# Patient Record
Sex: Female | Born: 1965
Health system: Southern US, Community
[De-identification: ages and names within clinical notes are randomized; demographics above are authoritative.]

## PROBLEM LIST (undated history)

## (undated) DIAGNOSIS — I10 Essential (primary) hypertension: Secondary | ICD-10-CM

## (undated) DIAGNOSIS — Z8269 Family history of other diseases of the musculoskeletal system and connective tissue: Secondary | ICD-10-CM

## (undated) DIAGNOSIS — K561 Intussusception: Secondary | ICD-10-CM

## (undated) DIAGNOSIS — R0789 Other chest pain: Secondary | ICD-10-CM

## (undated) DIAGNOSIS — M51369 Other intervertebral disc degeneration, lumbar region without mention of lumbar back pain or lower extremity pain: Secondary | ICD-10-CM

## (undated) DIAGNOSIS — E559 Vitamin D deficiency, unspecified: Secondary | ICD-10-CM

## (undated) DIAGNOSIS — F488 Other specified nonpsychotic mental disorders: Secondary | ICD-10-CM

## (undated) DIAGNOSIS — M2550 Pain in unspecified joint: Secondary | ICD-10-CM

## (undated) DIAGNOSIS — Z9889 Other specified postprocedural states: Secondary | ICD-10-CM

## (undated) DIAGNOSIS — R232 Flushing: Secondary | ICD-10-CM

## (undated) DIAGNOSIS — R600 Localized edema: Secondary | ICD-10-CM

## (undated) DIAGNOSIS — M549 Dorsalgia, unspecified: Secondary | ICD-10-CM

## (undated) DIAGNOSIS — G479 Sleep disorder, unspecified: Secondary | ICD-10-CM

## (undated) DIAGNOSIS — F419 Anxiety disorder, unspecified: Secondary | ICD-10-CM

## (undated) DIAGNOSIS — Z780 Asymptomatic menopausal state: Secondary | ICD-10-CM

## (undated) DIAGNOSIS — R4189 Other symptoms and signs involving cognitive functions and awareness: Secondary | ICD-10-CM

## (undated) DIAGNOSIS — R112 Nausea with vomiting, unspecified: Secondary | ICD-10-CM

## (undated) DIAGNOSIS — M5136 Other intervertebral disc degeneration, lumbar region: Secondary | ICD-10-CM

## (undated) DIAGNOSIS — R635 Abnormal weight gain: Secondary | ICD-10-CM

## (undated) DIAGNOSIS — E785 Hyperlipidemia, unspecified: Secondary | ICD-10-CM

## (undated) DIAGNOSIS — K829 Disease of gallbladder, unspecified: Secondary | ICD-10-CM

## (undated) DIAGNOSIS — R0600 Dyspnea, unspecified: Secondary | ICD-10-CM

## (undated) HISTORY — DX: Disease of gallbladder, unspecified: K82.9

## (undated) HISTORY — DX: Flushing: R23.2

## (undated) HISTORY — DX: Other chest pain: R07.89

## (undated) HISTORY — DX: Essential (primary) hypertension: I10

## (undated) HISTORY — DX: Intussusception: K56.1

## (undated) HISTORY — DX: Dyspnea, unspecified: R06.00

## (undated) HISTORY — DX: Other symptoms and signs involving cognitive functions and awareness: R41.89

## (undated) HISTORY — PX: GALLBLADDER SURGERY: SHX652

## (undated) HISTORY — DX: Vitamin D deficiency, unspecified: E55.9

## (undated) HISTORY — DX: Dorsalgia, unspecified: M54.9

## (undated) HISTORY — DX: Other intervertebral disc degeneration, lumbar region: M51.36

## (undated) HISTORY — DX: Anxiety disorder, unspecified: F41.9

## (undated) HISTORY — DX: Other intervertebral disc degeneration, lumbar region without mention of lumbar back pain or lower extremity pain: M51.369

## (undated) HISTORY — DX: Sleep disorder, unspecified: G47.9

## (undated) HISTORY — DX: Hyperlipidemia, unspecified: E78.5

## (undated) HISTORY — DX: Pain in unspecified joint: M25.50

## (undated) HISTORY — DX: Localized edema: R60.0

## (undated) HISTORY — DX: Abnormal weight gain: R63.5

## (undated) HISTORY — DX: Asymptomatic menopausal state: Z78.0

## (undated) HISTORY — DX: Family history of other diseases of the musculoskeletal system and connective tissue: Z82.69

---
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
## 1998-01-04 ENCOUNTER — Inpatient Hospital Stay (HOSPITAL_COMMUNITY): Admission: AD | Admit: 1998-01-04 | Discharge: 1998-01-06 | Payer: Self-pay | Admitting: *Deleted

## 1998-01-25 ENCOUNTER — Encounter (HOSPITAL_COMMUNITY): Admission: RE | Admit: 1998-01-25 | Discharge: 1998-04-25 | Payer: Self-pay | Admitting: Obstetrics and Gynecology

## 1998-02-08 ENCOUNTER — Other Ambulatory Visit: Admission: RE | Admit: 1998-02-08 | Discharge: 1998-02-08 | Payer: Self-pay | Admitting: Obstetrics and Gynecology

## 1998-04-27 ENCOUNTER — Encounter (HOSPITAL_COMMUNITY): Admission: RE | Admit: 1998-04-27 | Discharge: 1998-07-26 | Payer: Self-pay | Admitting: *Deleted

## 1998-07-27 ENCOUNTER — Encounter (HOSPITAL_COMMUNITY): Admission: RE | Admit: 1998-07-27 | Discharge: 1998-10-25 | Payer: Self-pay | Admitting: *Deleted

## 1999-09-11 ENCOUNTER — Other Ambulatory Visit: Admission: RE | Admit: 1999-09-11 | Discharge: 1999-09-11 | Payer: Self-pay | Admitting: Obstetrics and Gynecology

## 2000-05-11 ENCOUNTER — Inpatient Hospital Stay (HOSPITAL_COMMUNITY): Admission: AD | Admit: 2000-05-11 | Discharge: 2000-05-11 | Payer: Self-pay | Admitting: Obstetrics and Gynecology

## 2000-05-12 ENCOUNTER — Inpatient Hospital Stay (HOSPITAL_COMMUNITY): Admission: AD | Admit: 2000-05-12 | Discharge: 2000-05-15 | Payer: Self-pay | Admitting: Obstetrics and Gynecology

## 2000-05-18 ENCOUNTER — Encounter: Admission: RE | Admit: 2000-05-18 | Discharge: 2000-08-16 | Payer: Self-pay | Admitting: Obstetrics and Gynecology

## 2000-06-28 ENCOUNTER — Other Ambulatory Visit: Admission: RE | Admit: 2000-06-28 | Discharge: 2000-06-28 | Payer: Self-pay | Admitting: Obstetrics and Gynecology

## 2000-08-18 ENCOUNTER — Encounter: Admission: RE | Admit: 2000-08-18 | Discharge: 2000-09-17 | Payer: Self-pay | Admitting: Obstetrics and Gynecology

## 2000-09-18 ENCOUNTER — Encounter: Admission: RE | Admit: 2000-09-18 | Discharge: 2000-10-18 | Payer: Self-pay | Admitting: Obstetrics and Gynecology

## 2000-11-18 ENCOUNTER — Encounter: Admission: RE | Admit: 2000-11-18 | Discharge: 2000-12-18 | Payer: Self-pay | Admitting: Obstetrics and Gynecology

## 2000-12-19 ENCOUNTER — Encounter: Admission: RE | Admit: 2000-12-19 | Discharge: 2001-01-18 | Payer: Self-pay | Admitting: Obstetrics and Gynecology

## 2001-02-18 ENCOUNTER — Encounter: Admission: RE | Admit: 2001-02-18 | Discharge: 2001-03-20 | Payer: Self-pay | Admitting: Obstetrics and Gynecology

## 2001-02-25 ENCOUNTER — Encounter: Admission: RE | Admit: 2001-02-25 | Discharge: 2001-05-26 | Payer: Self-pay | Admitting: Internal Medicine

## 2001-04-20 ENCOUNTER — Encounter: Admission: RE | Admit: 2001-04-20 | Discharge: 2001-05-20 | Payer: Self-pay | Admitting: Obstetrics and Gynecology

## 2001-05-21 ENCOUNTER — Encounter: Admission: RE | Admit: 2001-05-21 | Discharge: 2001-06-20 | Payer: Self-pay | Admitting: Obstetrics and Gynecology

## 2001-09-09 ENCOUNTER — Encounter: Payer: Self-pay | Admitting: Obstetrics and Gynecology

## 2001-09-09 ENCOUNTER — Encounter: Admission: RE | Admit: 2001-09-09 | Discharge: 2001-09-09 | Payer: Self-pay | Admitting: Obstetrics and Gynecology

## 2001-10-12 ENCOUNTER — Other Ambulatory Visit: Admission: RE | Admit: 2001-10-12 | Discharge: 2001-10-12 | Payer: Self-pay | Admitting: Obstetrics and Gynecology

## 2003-02-12 ENCOUNTER — Other Ambulatory Visit: Admission: RE | Admit: 2003-02-12 | Discharge: 2003-02-12 | Payer: Self-pay | Admitting: Obstetrics and Gynecology

## 2004-12-18 ENCOUNTER — Encounter: Admission: RE | Admit: 2004-12-18 | Discharge: 2004-12-18 | Payer: Self-pay | Admitting: Internal Medicine

## 2006-05-29 ENCOUNTER — Emergency Department (HOSPITAL_COMMUNITY): Admission: EM | Admit: 2006-05-29 | Discharge: 2006-05-29 | Payer: Self-pay | Admitting: Family Medicine

## 2007-07-15 ENCOUNTER — Telehealth: Payer: Self-pay | Admitting: *Deleted

## 2007-08-12 ENCOUNTER — Encounter: Admission: RE | Admit: 2007-08-12 | Discharge: 2007-08-12 | Payer: Self-pay | Admitting: Obstetrics and Gynecology

## 2009-11-14 ENCOUNTER — Emergency Department (HOSPITAL_COMMUNITY): Admission: EM | Admit: 2009-11-14 | Discharge: 2009-11-15 | Payer: Self-pay | Admitting: Emergency Medicine

## 2009-11-16 ENCOUNTER — Emergency Department (HOSPITAL_COMMUNITY): Admission: EM | Admit: 2009-11-16 | Discharge: 2009-11-16 | Payer: Self-pay | Admitting: Family Medicine

## 2009-11-22 ENCOUNTER — Encounter (INDEPENDENT_AMBULATORY_CARE_PROVIDER_SITE_OTHER): Payer: Self-pay | Admitting: General Surgery

## 2009-11-22 ENCOUNTER — Observation Stay (HOSPITAL_COMMUNITY): Admission: RE | Admit: 2009-11-22 | Discharge: 2009-11-23 | Payer: Self-pay | Admitting: General Surgery

## 2010-01-15 ENCOUNTER — Encounter: Admission: RE | Admit: 2010-01-15 | Discharge: 2010-01-15 | Payer: Self-pay | Admitting: Obstetrics and Gynecology

## 2010-05-11 ENCOUNTER — Encounter: Payer: Self-pay | Admitting: Obstetrics and Gynecology

## 2010-07-04 LAB — BASIC METABOLIC PANEL
BUN: 9 mg/dL (ref 6–23)
GFR calc non Af Amer: >60 mL/min (ref >60)
Glucose, Bld: 104 mg/dL — ABNORMAL HIGH (ref 70–99)
Potassium: 3.8 mEq/L (ref 3.5–5.1)

## 2010-07-04 LAB — CBC
HCT: 39.8 % (ref 36.0–46.0)
MCHC: 35.9 g/dL (ref 30.0–36.0)
MCV: 84.5 fL (ref 78.0–100.0)
RDW: 12 % (ref 11.5–15.5)

## 2010-07-04 LAB — SURGICAL PCR SCREEN: MRSA, PCR: NEGATIVE

## 2010-07-05 LAB — POCT I-STAT, CHEM 8
BUN: 13 mg/dL (ref 6–23)
BUN: 15 mg/dL (ref 6–23)
Calcium, Ion: 1.12 mmol/L (ref 1.12–1.32)
Chloride: 106 meq/L (ref 96–112)
Creatinine, Ser: 0.8 mg/dL (ref 0.4–1.2)
Glucose, Bld: 109 mg/dL — ABNORMAL HIGH (ref 70–99)
HCT: 38 % (ref 36.0–46.0)
HCT: 43 % (ref 36.0–46.0)
Hemoglobin: 12.9 g/dL (ref 12.0–15.0)
Hemoglobin: 14.6 g/dL (ref 12.0–15.0)
Potassium: 3.8 meq/L (ref 3.5–5.1)
Sodium: 138 mEq/L (ref 135–145)
Sodium: 140 mEq/L (ref 135–145)
TCO2: 23 mmol/L (ref 0–100)
TCO2: 27 mmol/L (ref 0–100)

## 2010-07-05 LAB — CBC
HCT: 37.8 % (ref 36.0–46.0)
MCH: 31 pg (ref 26.0–34.0)
MCHC: 34.7 g/dL (ref 30.0–36.0)
MCV: 88.7 fL (ref 78.0–100.0)
MCV: 89.1 fL (ref 78.0–100.0)
Platelets: 241 10*3/uL (ref 150–400)
RBC: 4.66 MIL/uL (ref 3.87–5.11)
RDW: 12.5 % (ref 11.5–15.5)
RDW: 12.9 % (ref 11.5–15.5)

## 2010-07-05 LAB — DIFFERENTIAL
Eosinophils Absolute: 0.1 10*3/uL (ref 0.0–0.7)
Eosinophils Relative: 2 % (ref 0–5)
Lymphs Abs: 0.9 10*3/uL (ref 0.7–4.0)
Monocytes Absolute: 0.3 10*3/uL (ref 0.1–1.0)

## 2010-07-05 LAB — HEPATIC FUNCTION PANEL
Alkaline Phosphatase: 69 U/L (ref 39–117)
Bilirubin, Direct: 0.1 mg/dL (ref 0.0–0.3)
Bilirubin, Direct: 0.3 mg/dL (ref 0.0–0.3)
Indirect Bilirubin: 0.2 mg/dL — ABNORMAL LOW (ref 0.3–0.9)
Indirect Bilirubin: 0.8 mg/dL (ref 0.3–0.9)
Total Bilirubin: 0.5 mg/dL (ref 0.3–1.2)
Total Protein: 7.3 g/dL (ref 6.0–8.3)

## 2010-07-05 LAB — POCT CARDIAC MARKERS
CKMB, poc: <1 ng/mL — ABNORMAL LOW (ref 1.0–8.0)
Myoglobin, poc: 51 ng/mL (ref 12–200)
Troponin i, poc: <0.05 ng/mL (ref 0.00–0.09)

## 2010-07-05 LAB — LIPASE, BLOOD: Lipase: 35 U/L (ref 11–59)

## 2010-07-05 LAB — POCT URINALYSIS DIP (DEVICE)
Bilirubin Urine: NEGATIVE
Glucose, UA: NEGATIVE mg/dL
Hgb urine dipstick: NEGATIVE
Nitrite: NEGATIVE
Urobilinogen, UA: 1 mg/dL (ref 0.0–1.0)

## 2010-09-05 NOTE — H&P (Signed)
[facility]  Patient:    Lori Luna, Lori Luna                      MRN: [id number] Adm. Date:  [id number] Attending:  Genia Del                         History and Physical  CHIEF COMPLAINT:              Labor.  HISTORY OF PRESENT ILLNESS:   [age]-year-old white female, G3, P2, EDD of May 23, 2000, at 38+ weeks, in active labor.  ALLERGIES:                    No known drug allergies.  MEDICATIONS:                  Prenatal vitamins.  PAST MEDICAL HISTORY:         History of spontaneous vaginal delivery x 2.  No other medical or surgical hospitalizations, except for rhinoplasty.  FAMILY HISTORY:               Hypertension, heart disease, insulin-dependent diabetes.  PREGNANCY HISTORY:            Remarkable for blood type O negative.  Father of the baby is B negative.  Rh antibody negative.  Rubella nonimmune.  Hepatitis B surface antigen negative.  HIV nonreactive.  Pregnancy complicated by borderline elevation of blood pressure and presumed macrosomia.  PHYSICAL EXAMINATION:  GENERAL:                      Well-developed, well-nourished white female in no apparent distress.  HEENT:                        Normal.  LUNGS:                        Clear.  HEART:                        Regular rhythm.  ABDOMEN:                      Soft, gravid, nontender.  Estimated fetal weight 8-1/2 to 9 pounds.  PELVIC:                       Cervix is 6-7 cm, 90%, vertex, -2.  Artificial rupture of membranes clear.  NEUROLOGIC:                   Nonfocal.  EXTREMITIES:                  DTRs 1 to 2+.  No evidence of clonus.  LABORATORY DATA:              CBC revealing a normal platelet count of 202 and a normal hemoglobin.  She had a uric acid of 6.9.  Normal liver function tests except for a borderline elevation of SGPT at 43.  IMPRESSION:                   1. Gestational hypertension with no stigmata of  preeclampsia.  2. Active labor.                               3. Presumed large for gestational age.                               4. History of gestational hypertension in                                  previous pregnancy.  PLAN:                         Proceed with epidural, artificial rupture of membranes, and attempted vaginal delivery.  Will monitor blood pressures closely postpartum.  If blood pressure elevation is persistent, will use magnesium prophylaxis.  Will consider repeating laboratories in six hours. DD:  05/12/00 TD:  05/12/00 Job: [id number]

## 2011-08-20 ENCOUNTER — Other Ambulatory Visit: Payer: Self-pay | Admitting: Dermatology

## 2012-04-21 ENCOUNTER — Other Ambulatory Visit: Payer: Self-pay | Admitting: Dermatology

## 2012-04-27 ENCOUNTER — Other Ambulatory Visit: Payer: Self-pay | Admitting: Family Medicine

## 2012-04-27 ENCOUNTER — Other Ambulatory Visit (HOSPITAL_COMMUNITY)
Admission: RE | Admit: 2012-04-27 | Discharge: 2012-04-27 | Disposition: A | Discharge status: Home / Self Care | Payer: BC Managed Care – PPO | Source: Ambulatory Visit | Attending: Family Medicine | Admitting: Family Medicine

## 2012-04-27 DIAGNOSIS — N6310 Unspecified lump in the right breast, unspecified quadrant: Secondary | ICD-10-CM

## 2012-04-27 DIAGNOSIS — Z124 Encounter for screening for malignant neoplasm of cervix: Secondary | ICD-10-CM | POA: Insufficient documentation

## 2012-05-03 ENCOUNTER — Other Ambulatory Visit: Payer: Self-pay

## 2012-05-11 ENCOUNTER — Other Ambulatory Visit: Payer: Self-pay

## 2013-04-28 ENCOUNTER — Other Ambulatory Visit: Payer: Self-pay

## 2013-04-28 DIAGNOSIS — Z1231 Encounter for screening mammogram for malignant neoplasm of breast: Secondary | ICD-10-CM

## 2013-05-02 ENCOUNTER — Telehealth: Payer: Self-pay

## 2013-05-02 MED ORDER — ENALAPRIL-HYDROCHLOROTHIAZIDE 5-12.5 MG PO TABS
1.0000 | ORAL_TABLET | Freq: Every day | ORAL | Status: DC
Start: 2013-05-02 — End: 2019-02-13

## 2013-05-02 NOTE — Telephone Encounter (Signed)
Refilled for 15 day supply, with note stating pt needs to call for appt asap.

## 2013-06-30 ENCOUNTER — Ambulatory Visit
Admission: RE | Admit: 2013-06-30 | Discharge: 2013-06-30 | Disposition: A | Discharge status: Home / Self Care | Payer: BC Managed Care – PPO | Source: Ambulatory Visit

## 2013-06-30 ENCOUNTER — Other Ambulatory Visit: Payer: Self-pay

## 2013-06-30 ENCOUNTER — Ambulatory Visit: Payer: BC Managed Care – PPO

## 2013-06-30 DIAGNOSIS — Z1231 Encounter for screening mammogram for malignant neoplasm of breast: Secondary | ICD-10-CM

## 2013-07-03 ENCOUNTER — Other Ambulatory Visit: Payer: Self-pay | Admitting: Family Medicine

## 2013-07-03 DIAGNOSIS — R928 Other abnormal and inconclusive findings on diagnostic imaging of breast: Secondary | ICD-10-CM

## 2013-07-14 ENCOUNTER — Ambulatory Visit
Admission: RE | Admit: 2013-07-14 | Discharge: 2013-07-14 | Disposition: A | Discharge status: Home / Self Care | Payer: BC Managed Care – PPO | Source: Ambulatory Visit | Attending: Family Medicine | Admitting: Family Medicine

## 2013-07-14 DIAGNOSIS — R928 Other abnormal and inconclusive findings on diagnostic imaging of breast: Secondary | ICD-10-CM

## 2013-08-04 ENCOUNTER — Ambulatory Visit
Admission: RE | Admit: 2013-08-04 | Discharge: 2013-08-04 | Disposition: A | Discharge status: Home / Self Care | Payer: BC Managed Care – PPO | Source: Ambulatory Visit | Attending: Family Medicine | Admitting: Family Medicine

## 2013-08-04 ENCOUNTER — Other Ambulatory Visit: Payer: Self-pay | Admitting: Family Medicine

## 2013-08-04 DIAGNOSIS — N6320 Unspecified lump in the left breast, unspecified quadrant: Secondary | ICD-10-CM

## 2013-08-04 DIAGNOSIS — N63 Unspecified lump in unspecified breast: Secondary | ICD-10-CM

## 2013-08-04 HISTORY — PX: BREAST BIOPSY: SHX20

## 2013-08-07 ENCOUNTER — Ambulatory Visit
Admission: RE | Admit: 2013-08-07 | Discharge: 2013-08-07 | Disposition: A | Discharge status: Home / Self Care | Payer: BC Managed Care – PPO | Source: Ambulatory Visit | Attending: Family Medicine | Admitting: Family Medicine

## 2013-08-07 ENCOUNTER — Other Ambulatory Visit: Payer: Self-pay | Admitting: Family Medicine

## 2013-08-07 DIAGNOSIS — N6320 Unspecified lump in the left breast, unspecified quadrant: Secondary | ICD-10-CM

## 2013-08-07 HISTORY — PX: BREAST BIOPSY: SHX20

## 2013-08-16 ENCOUNTER — Other Ambulatory Visit: Payer: BC Managed Care – PPO

## 2014-03-18 ENCOUNTER — Encounter: Payer: Self-pay | Admitting: *Deleted

## 2016-06-08 DIAGNOSIS — E559 Vitamin D deficiency, unspecified: Secondary | ICD-10-CM | POA: Diagnosis not present

## 2016-06-08 DIAGNOSIS — Z0000 Encounter for general adult medical examination without abnormal findings: Secondary | ICD-10-CM | POA: Diagnosis not present

## 2016-06-08 DIAGNOSIS — I10 Essential (primary) hypertension: Secondary | ICD-10-CM | POA: Diagnosis not present

## 2016-06-08 DIAGNOSIS — E784 Other hyperlipidemia: Secondary | ICD-10-CM | POA: Diagnosis not present

## 2016-06-10 ENCOUNTER — Telehealth: Payer: Self-pay | Admitting: Cardiovascular Disease

## 2016-06-10 NOTE — Telephone Encounter (Signed)
Records received from [place] at [facility] for apt on 06/19/16 with Dr Oval Linsey. Records put in Dr Blenda Mounts schedule. CN

## 2016-06-19 ENCOUNTER — Ambulatory Visit (INDEPENDENT_AMBULATORY_CARE_PROVIDER_SITE_OTHER): Payer: 59 | Admitting: Cardiovascular Disease

## 2016-06-19 ENCOUNTER — Encounter: Payer: Self-pay | Admitting: Cardiovascular Disease

## 2016-06-19 VITALS — BP 124/78 | HR 79 | Ht 66.5 in | Wt 216.0 lb

## 2016-06-19 DIAGNOSIS — I10 Essential (primary) hypertension: Secondary | ICD-10-CM | POA: Insufficient documentation

## 2016-06-19 DIAGNOSIS — R079 Chest pain, unspecified: Secondary | ICD-10-CM

## 2016-06-19 DIAGNOSIS — R0789 Other chest pain: Secondary | ICD-10-CM | POA: Diagnosis not present

## 2016-06-19 DIAGNOSIS — E785 Hyperlipidemia, unspecified: Secondary | ICD-10-CM | POA: Insufficient documentation

## 2016-06-19 DIAGNOSIS — E7800 Pure hypercholesterolemia, unspecified: Secondary | ICD-10-CM | POA: Diagnosis not present

## 2016-06-19 HISTORY — DX: Hyperlipidemia, unspecified: E78.5

## 2016-06-19 HISTORY — DX: Other chest pain: R07.89

## 2016-06-19 HISTORY — DX: Essential (primary) hypertension: I10

## 2016-06-19 NOTE — Patient Instructions (Addendum)
Medication Instructions:  Your physician recommends that you continue on your current medications as directed. Please refer to the Current Medication list given to you today.  Labwork: NONE  Testing/Procedures: CARDIAC CTA   Follow-Up: Your physician wants you to follow-up in: 1 [place] will receive a reminder letter in the mail two months in advance. If you don't receive a letter, please call our office to schedule the follow-up appointment.  If you need a refill on your cardiac medications before your next appointment, please call your pharmacy.   Cardiac CT Angiogram A cardiac CT angiogram is a procedure to look at the heart and the area around the heart. It may be done to help find the cause of chest pains or other symptoms of heart disease. During this procedure, a large X-ray machine, called a CT scanner, takes detailed pictures of the heart and the surrounding area after a dye (contrast material) has been injected into blood vessels in the area. The procedure is also sometimes called a coronary CT angiogram, coronary artery scanning, or CTA. A cardiac CT angiogram allows the health care provider to see how well blood is flowing to and from the heart. The health care provider will be able to see if there are any problems, such as:  Blockage or narrowing of the coronary arteries in the heart.  Fluid around the heart.  Signs of weakness or disease in the muscles, valves, and tissues of the heart. Tell a health care provider about:  Any allergies you have. This is especially important if you have had a previous allergic reaction to contrast dye.  All medicines you are taking, including vitamins, herbs, eye drops, creams, and over-the-counter medicines.  Any blood disorders you have.  Any surgeries you have had.  Any medical conditions you have.  Whether you are pregnant or may be pregnant.  Any anxiety disorders, chronic pain, or other conditions you have that may  increase your stress or prevent you from lying still. What are the risks? Generally, this is a safe procedure. However, problems may occur, including:  Bleeding.  Infection.  Allergic reactions to medicines or dyes.  Damage to other structures or organs.  Kidney damage from the dye or contrast that is used.  Increased risk of cancer from radiation exposure. This risk is low. Talk with your health care provider about:  The risks and benefits of testing.  How you can receive the lowest dose of radiation. What happens before the procedure?  Wear comfortable clothing and remove any jewelry, glasses, dentures, and hearing aids.  Follow instructions from your health care provider about eating and drinking. This may include:  For 12 hours before the test - avoid caffeine. This includes tea, coffee, soda, energy drinks, and diet pills. Drink plenty of water or other fluids that do not have caffeine in them. Being well-hydrated can prevent complications.  For 4-6 hours before the test - stop eating and drinking. The contrast dye can cause nausea, but this is less likely if your stomach is empty.  Ask your health care provider about changing or stopping your regular medicines. This is especially important if you are taking diabetes medicines, blood thinners, or medicines to treat erectile dysfunction. What happens during the procedure?  Hair on your chest may need to be removed so that small sticky patches called electrodes can be placed on your chest. These will transmit information that helps to monitor your heart during the test.  An IV tube will be  inserted into one of your veins.  You might be given a medicine to control your heart rate during the test. This will help to ensure that good images are obtained.  You will be asked to lie on an exam table. This table will slide in and out of the CT machine during the procedure.  Contrast dye will be injected into the IV tube. You might  feel warm, or you may get a metallic taste in your mouth.  You will be given a medicine (nitroglycerin) to relax (dilate) the arteries in your heart.  The table that you are lying on will move into the CT machine tunnel for the scan.  The person running the machine will give you instructions while the scans are being done. You may be asked to:  Keep your arms above your head.  Hold your breath.  Stay very still, even if the table is moving.  When the scanning is complete, you will be moved out of the machine.  The IV tube will be removed. The procedure may vary among health care providers and hospitals. What happens after the procedure?  You might feel warm, or you may get a metallic taste in your mouth from the contrast dye.  You may have a headache from the nitroglycerin.  After the procedure, drink water or other fluids to wash (flush) the contrast material out of your body.  Contact a health care provider if you have any symptoms of allergy to the contrast. These symptoms include:  Shortness of breath.  Rash or hives.  A racing heartbeat.  Most people can return to their normal activities right after the procedure. Ask your health care provider what activities are safe for you.  It is up to you to get the results of your procedure. Ask your health care provider, or the department that is doing the procedure, when your results will be ready. Summary  A cardiac CT angiogram is a procedure to look at the heart and the area around the heart. It may be done to help find the cause of chest pains or other symptoms of heart disease.  During this procedure, a large X-ray machine, called a CT scanner, takes detailed pictures of the heart and the surrounding area after a dye (contrast material) has been injected into blood vessels in the area.  Ask your health care provider about changing or stopping your regular medicines before the procedure. This is especially important if you  are taking diabetes medicines, blood thinners, or medicines to treat erectile dysfunction.  After the procedure, drink water or other fluids to wash (flush) the contrast material out of your body. This information is not intended to replace advice given to you by your health care provider. Make sure you discuss any questions you have with your health care provider. Document Released: 03/19/2008 Document Revised: 02/24/2016 Document Reviewed: 02/24/2016 Elsevier Interactive Patient Education  2017 [employer].

## 2016-06-19 NOTE — Progress Notes (Signed)
Cardiology Office Note   Date:  06/19/2016   ID:  PROMYSE GUNNISON, DOB 01-01-1966, MRN [id number]  PCP:  Cari Caraway, MD  Cardiologist:   Skeet Latch, MD   Chief Complaint  Patient presents with  . New Evaluation    pt c/o chest pain/pressure and has early familiy history of CAD, previous pt of DR. Varanasi     History of Present Illness: Lori Luna is a 51 y.o. female with hypertension and hyperlipidemia who presents for cardiovascular risk assessment.  Lori Luna saw Dr. Cari Caraway on 06/08/16. She was asymptomatic but requested referral to cardiology for cardiovascular risk assessment due to a family history of coronary artery disease.  She reports episodes of chest heaviness that has been ongoing since she took prednisone almost two months ago.  The sensation occurs in her left chest and is worse With exertion but is always present. There is no associated shortness of breath, nausea, or diaphoresis. It feels like a heaviness in her chest. It radiates towards her left arm. She wonders if anything could be related to stress. She notes that her cholesterol has always been a little bit on the high side but she has never required any medications. She also notes intermittent episodes of palpitations that last for a few seconds at a time. There is no associated lightheadedness, dizziness, or chest pain.  She denies lower extremity edema, orthopnea, or PND. She started back exercising 2 months ago and notes that after 20 minutes she feels exhausted. The heaviness is worse with this as well as when she tries to carry heavy objects.  Her father had several heart attacks. The first was at age 43. She checks her blood pressure at home and it typically is around 130/80.    Past Medical History:  Diagnosis Date  . Atypical chest pain 06/19/2016  . Essential hypertension 06/19/2016  . Hyperlipidemia   . Hyperlipidemia 06/19/2016  . Hypertension     Past Surgical History:    Procedure Laterality Date  . GALLBLADDER SURGERY       Current Outpatient Prescriptions  Medication Sig Dispense Refill  . Calcium Carbonate-Vit D-Min (CALCIUM 1200 PO) Take 1,200 mg by mouth daily.    . Enalapril-Hydrochlorothiazide 5-12.5 MG per tablet Take 1 tablet by mouth daily. 15 tablet 0  . MAGNESIUM PO Take 1 capsule by mouth daily.    . meclizine (ANTIVERT) 25 MG tablet Take 25 mg by mouth 3 (three) times daily as needed for dizziness.    . pyridoxine (B-6) 100 MG tablet Take 100 mg by mouth daily.    . rizatriptan (MAXALT) 10 MG tablet Take 10 mg by mouth as needed for migraine. May repeat in 2 hours if needed    . Vitamin D, Ergocalciferol, (DRISDOL) 50000 units CAPS capsule Take 50,000 Units by mouth once a week.     No current facility-administered medications for this visit.     Allergies:   Patient has no allergy information on record.    Social History:  The patient  reports that she has never smoked. She has never used smokeless tobacco. She reports that she drinks alcohol. She reports that she does not use drugs.   Family History:  The patient's family history includes CAD in her father and maternal grandmother; Cancer in her paternal grandfather and paternal grandmother; Diabetes in her maternal grandmother; Hypertension in her father and mother; Stroke in her maternal aunt.    ROS:  Please see the history of  present illness.   Otherwise, review of systems are positive for none.   All other systems are reviewed and negative.    PHYSICAL EXAM: VS:  BP 124/78   Pulse 79   Ht 5' 6.5" (1.689 m)   Wt 98 kg (216 lb)   BMI 34.34 kg/m  , BMI Body mass index is 34.34 kg/m. GENERAL:  Well appearing HEENT:  Pupils equal round and reactive, fundi not visualized, oral mucosa unremarkable NECK:  No jugular venous distention, waveform within normal limits, carotid upstroke brisk and symmetric, no bruits, no thyromegaly LYMPHATICS:  No cervical adenopathy LUNGS:  Clear  to auscultation bilaterally HEART:  RRR.  PMI not displaced or sustained,S1 and S2 within normal limits, no S3, no S4, no clicks, no rubs, no murmurs ABD:  Flat, positive bowel sounds normal in frequency in pitch, no bruits, no rebound, no guarding, no midline pulsatile mass, no hepatomegaly, no splenomegaly EXT:  2 plus pulses throughout, no edema, no cyanosis no clubbing SKIN:  No rashes no nodules NEURO:  Cranial nerves II through XII grossly intact, motor grossly intact throughout PSYCH:  Cognitively intact, oriented to person place and time    EKG:  EKG is ordered today. The ekg ordered today demonstrates sinus rhythm rate 76 bpm.    Recent Labs: No results found for requested labs within last 8760 hours.    Lipid Panel No results found for: CHOL, TRIG, HDL, CHOLHDL, VLDL, LDLCALC, LDLDIRECT   06/08/16: Total cholesterol 220, triglycerides 175, HDL 56, LDL 129 Sodium 141, potassium 3.8, BUN 17, creatinine 0.1 AST 16, ALT 24  Wt Readings from Last 3 Encounters:  06/19/16 98 kg (216 lb)      ASSESSMENT AND PLAN:  # Chest pain:  Lori Luna chest pain seems unlikely to be cardiac.  However she does report an exertional component.  We will get a CT-A to better assess this and to risk stratify.  # Hypertension: BP was initially elevated but improved with rest.  Continue HCTZ and enalapril.  # Hyperlipidemia: ASCVD 10 year risk is 2.4%  Current medicines are reviewed at length with the patient today.  The patient does not have concerns regarding medicines.  The following changes have been made:  no change  Labs/ tests ordered today include:   Orders Placed This Encounter  Procedures  . CT CORONARY MORPH W/CTA COR W/SCORE W/CA W/CM &/OR WO/CM  . EKG 12-Lead     Disposition:   FU with Manan Olmo C. Oval Linsey, MD, [facility] in 1 year.    This note was written with the assistance of speech recognition software.  Please excuse any transcriptional errors.  Signed, Shatora Weatherbee  C. Oval Linsey, MD, [facility]  06/19/2016 1:27 PM    [facility]

## 2016-07-09 ENCOUNTER — Encounter: Payer: Self-pay | Admitting: Cardiovascular Disease

## 2016-07-29 ENCOUNTER — Encounter (HOSPITAL_COMMUNITY): Payer: Self-pay

## 2016-07-29 ENCOUNTER — Ambulatory Visit (HOSPITAL_COMMUNITY)
Admission: RE | Admit: 2016-07-29 | Discharge: 2016-07-29 | Disposition: A | Discharge status: Home / Self Care | Payer: 59 | Source: Ambulatory Visit | Attending: Cardiovascular Disease | Admitting: Cardiovascular Disease

## 2016-07-29 DIAGNOSIS — R079 Chest pain, unspecified: Secondary | ICD-10-CM | POA: Insufficient documentation

## 2016-07-29 DIAGNOSIS — R918 Other nonspecific abnormal finding of lung field: Secondary | ICD-10-CM | POA: Diagnosis not present

## 2016-07-29 MED ORDER — METOPROLOL TARTRATE 5 MG/5ML IV SOLN
INTRAVENOUS | Status: AC
  Administered 2016-07-29: 5 mg via INTRAVENOUS
  Filled 2016-07-29: qty 15

## 2016-07-29 MED ORDER — METOPROLOL TARTRATE 5 MG/5ML IV SOLN
INTRAVENOUS | Status: AC
  Filled 2016-07-29: qty 5

## 2016-07-29 MED ORDER — NITROGLYCERIN 0.4 MG SL SUBL
0.8000 mg | SUBLINGUAL_TABLET | Freq: Once | SUBLINGUAL | Status: AC
  Administered 2016-07-29: 0.8 mg via SUBLINGUAL

## 2016-07-29 MED ORDER — IOPAMIDOL (ISOVUE-370) INJECTION 76%
INTRAVENOUS | Status: AC
  Administered 2016-07-29: 80 mL
  Filled 2016-07-29: qty 100

## 2016-07-29 MED ORDER — NITROGLYCERIN 0.4 MG SL SUBL
SUBLINGUAL_TABLET | SUBLINGUAL | Status: AC
  Filled 2016-07-29: qty 2

## 2016-07-29 MED ORDER — METOPROLOL TARTRATE 5 MG/5ML IV SOLN
5.0000 mg | INTRAVENOUS | Status: DC | PRN
  Administered 2016-07-29 (×4): 5 mg via INTRAVENOUS

## 2016-10-20 DIAGNOSIS — J0140 Acute pansinusitis, unspecified: Secondary | ICD-10-CM | POA: Diagnosis not present

## 2017-03-15 DIAGNOSIS — J0100 Acute maxillary sinusitis, unspecified: Secondary | ICD-10-CM | POA: Diagnosis not present

## 2017-03-15 DIAGNOSIS — D1724 Benign lipomatous neoplasm of skin and subcutaneous tissue of left leg: Secondary | ICD-10-CM | POA: Diagnosis not present

## 2017-06-24 ENCOUNTER — Other Ambulatory Visit: Payer: Self-pay | Admitting: Family Medicine

## 2017-06-24 DIAGNOSIS — Z139 Encounter for screening, unspecified: Secondary | ICD-10-CM

## 2017-07-08 DIAGNOSIS — S0501XA Injury of conjunctiva and corneal abrasion without foreign body, right eye, initial encounter: Secondary | ICD-10-CM | POA: Diagnosis not present

## 2017-07-14 ENCOUNTER — Ambulatory Visit
Admission: RE | Admit: 2017-07-14 | Discharge: 2017-07-14 | Disposition: A | Discharge status: Home / Self Care | Payer: 59 | Source: Ambulatory Visit | Attending: Family Medicine | Admitting: Family Medicine

## 2017-07-14 DIAGNOSIS — Z139 Encounter for screening, unspecified: Secondary | ICD-10-CM

## 2017-07-14 DIAGNOSIS — Z1231 Encounter for screening mammogram for malignant neoplasm of breast: Secondary | ICD-10-CM | POA: Diagnosis not present

## 2017-07-26 DIAGNOSIS — I10 Essential (primary) hypertension: Secondary | ICD-10-CM | POA: Diagnosis not present

## 2017-07-26 DIAGNOSIS — E785 Hyperlipidemia, unspecified: Secondary | ICD-10-CM | POA: Diagnosis not present

## 2017-07-26 DIAGNOSIS — E559 Vitamin D deficiency, unspecified: Secondary | ICD-10-CM | POA: Diagnosis not present

## 2017-08-19 ENCOUNTER — Encounter: Payer: Self-pay | Admitting: Cardiovascular Disease

## 2017-08-19 ENCOUNTER — Ambulatory Visit: Payer: 59 | Admitting: Cardiovascular Disease

## 2017-08-19 VITALS — BP 126/86 | HR 77 | Ht 66.5 in | Wt 223.0 lb

## 2017-08-19 DIAGNOSIS — E7800 Pure hypercholesterolemia, unspecified: Secondary | ICD-10-CM

## 2017-08-19 DIAGNOSIS — I10 Essential (primary) hypertension: Secondary | ICD-10-CM | POA: Diagnosis not present

## 2017-08-19 DIAGNOSIS — R0789 Other chest pain: Secondary | ICD-10-CM | POA: Diagnosis not present

## 2017-08-19 NOTE — Progress Notes (Signed)
Cardiology Office Note   Date:  08/19/2017   ID:  Lori Luna, DOB 12-Mar-1966, MRN [id number]  PCP:  Cari Caraway, MD  Cardiologist:   Skeet Latch, MD   Chief Complaint  Patient presents with  . Follow-up     History of Present Illness: Lori Luna is a 52 y.o. female with hypertension and hyperlipidemia who presents for follow-up.  Lori Luna was initially seen 06/2016 for cardiovascular risk assessment.  She has a family history of CAD and had some episodes of atypical chest pain.  She was referred for coronary CT-a 07/2016 that revealed a coronary calcium score of 0.  Since her last appointment Lori Luna has been doing well physically but she has been struggling significantly with stress and anxiety.  She had almost a daily headache that she later found it was a tension headache.  She started on Lexapro 3 weeks ago and is feeling much better.  She still continues to be depressed but can feel herself coming out of it.  She rarely checks her blood pressure at home.  When she does it typically is in the 130s over 80s.  She notes that her diet has been poor and she has not exercised in quite some time.  In the past she had success with losing weight by using a personal trainer.  She sometimes has heaviness in her chest when she tries to exercise.  She also gets short of breath.  She has no lower extremity edema, orthopnea, or PND.    Past Medical History:  Diagnosis Date  . Atypical chest pain 06/19/2016  . Essential hypertension 06/19/2016  . Hyperlipidemia   . Hyperlipidemia 06/19/2016  . Hypertension     Past Surgical History:  Procedure Laterality Date  . GALLBLADDER SURGERY       Current Outpatient Medications  Medication Sig Dispense Refill  . Enalapril-Hydrochlorothiazide 5-12.5 MG per tablet Take 1 tablet by mouth daily. 15 tablet 0  . escitalopram (LEXAPRO) 10 MG tablet     . MAGNESIUM PO Take 1 capsule by mouth daily.    . meclizine  (ANTIVERT) 25 MG tablet Take 25 mg by mouth 3 (three) times daily as needed for dizziness.    . pyridoxine (B-6) 100 MG tablet Take 100 mg by mouth daily.     No current facility-administered medications for this visit.     Allergies:   Amoxicillin-pot clavulanate    Social History:  The patient  reports that she has never smoked. She has never used smokeless tobacco. She reports that she drinks alcohol. She reports that she does not use drugs.   Family History:  The patient's family history includes CAD in her father and maternal grandmother; Cancer in her paternal grandfather and paternal grandmother; Diabetes in her maternal grandmother; Hypertension in her father and mother; Stroke in her maternal aunt.    ROS:  Please see the history of present illness.   Otherwise, review of systems are positive for none.   All other systems are reviewed and negative.    PHYSICAL EXAM: VS:  BP 126/86   Pulse 77   Ht 5' 6.5" (1.689 m)   Wt 223 lb (101.2 kg)   BMI 35.45 kg/m  , BMI Body mass index is 35.45 kg/m. GENERAL:  Well appearing HEENT: Pupils equal round and reactive, fundi not visualized, oral mucosa unremarkable NECK:  No jugular venous distention, waveform within normal limits, carotid upstroke brisk and symmetric, no bruits LUNGS:  Clear  to auscultation bilaterally HEART:  RRR.  PMI not displaced or sustained,S1 and S2 within normal limits, no S3, no S4, no clicks, no rubs, no murmurs ABD:  Flat, positive bowel sounds normal in frequency in pitch, no bruits, no rebound, no guarding, no midline pulsatile mass, no hepatomegaly, no splenomegaly EXT:  2 plus pulses throughout, no edema, no cyanosis no clubbing SKIN:  No rashes no nodules NEURO:  Cranial nerves II through XII grossly intact, motor grossly intact throughout PSYCH:  Cognitively intact, oriented to person place and time   EKG:  EKG is ordered today. The ekg ordered 06/19/16 demonstrates sinus rhythm rate 76 bpm.    08/19/2017: Sinus rhythm.  Rate 77 bpm.  Coronary CT-A 07/29/16: 1. Coronary calcium score of 0. This was 0 percentile for age and sex matched control.  2. Normal coronary origin with right dominance.  3. No evidence of CAD.  Recent Labs: No results found for requested labs within last 8760 hours.    Lipid Panel No results found for: CHOL, TRIG, HDL, CHOLHDL, VLDL, LDLCALC, LDLDIRECT   06/08/16: Total cholesterol 220, triglycerides 175, HDL 56, LDL 129 Sodium 141, potassium 3.8, BUN 17, creatinine 0.1 AST 16, ALT 24  Wt Readings from Last 3 Encounters:  08/19/17 223 lb (101.2 kg)  06/19/16 216 lb (98 kg)      ASSESSMENT AND PLAN:  # Chest pain:  Lori Luna chest pain is not cardiac.  She had a coronary calcium score of 07/2016.  She was encouraged to start back exercising and work on her diet.  We will refer her to our care coordinator for nutrition and exercise coaching.  She should be following a Mediterranean diet.  # Hypertension: BP was initially elevated and improved somewhat with repeat but still remains above goal.  Her blood pressure goal is less than 130/80.  It is not high enough to titrate her medications at this time.  She needs to work on limiting her salt intake and start back exercising as above.  We will reevaluate in 4 months.  # Hyperlipidemia: ASCVD 10 year risk is 2.4%  Current medicines are reviewed at length with the patient today.  The patient does not have concerns regarding medicines.  The following changes have been made:  no change  Labs/ tests ordered today include:   No orders of the defined types were placed in this encounter.    Disposition:   FU with Edom Schmuhl C. Oval Linsey, MD, [facility] in 4 months.    Signed, Marlette Curvin C. Oval Linsey, MD, [facility]  08/19/2017 10:31 AM    [facility]

## 2017-08-19 NOTE — Patient Instructions (Addendum)
Medication Instructions:  Your physician recommends that you continue on your current medications as directed. Please refer to the Current Medication list given to you today.  Labwork: NONE  Testing/Procedures: NONE  Follow-Up: Your physician wants you to follow-up in: [place] will receive a reminder letter in the mail two months in advance. If you don't receive a letter, please call our office to schedule the follow-up appointment.  Any Other Special Instructions Will Be Listed Below (If Applicable).  WORK ON DIET AND EXERCISE TRY TO EXERCISE 150 MINUTES EACH WEEK  Lori Luna WILL BE IN TOUCH WITH YOU TO ARRANGE AN APPOINTMENT    If you need a refill on your cardiac medications before your next appointment, please call your pharmacy.

## 2017-08-23 DIAGNOSIS — I10 Essential (primary) hypertension: Secondary | ICD-10-CM | POA: Diagnosis not present

## 2017-08-23 DIAGNOSIS — Z6836 Body mass index (BMI) 36.0-36.9, adult: Secondary | ICD-10-CM | POA: Diagnosis not present

## 2017-08-26 ENCOUNTER — Telehealth: Payer: Self-pay

## 2017-08-26 NOTE — Telephone Encounter (Signed)
Called to schedule initial session. Scheduled for 5/15 at 4:30.

## 2017-09-01 ENCOUNTER — Ambulatory Visit (INDEPENDENT_AMBULATORY_CARE_PROVIDER_SITE_OTHER): Payer: 59

## 2017-09-01 DIAGNOSIS — Z0000 Encounter for general adult medical examination without abnormal findings: Secondary | ICD-10-CM

## 2017-09-01 NOTE — Progress Notes (Signed)
Week: 1  Progress Notes: Pt says that she has recently been under a lot of stress, and eat out more because of it. Says she eats out maybe 4-5 times a week, and goes out for a coke almost everyday. Has had signifivant weight loss in the pass, but due to an injury, has had to cut down on physical activity. Pt is concerned that due to her having her gallbladder removed it has had some effect on her weight. Would like to know if there is any dietary supplements or diets for this. Also says she is constantly bloated. Pt needs foods that are very convenient.   Challenges:   Opportunities:   Client Commitment/Agreement for Next Session: Pt agreed to reduce the amount of times she eats out to 3 times a week, and reduce her cola beverages to 2-3x. Care guide agreed to reach out to her provider about gallbladder concerns. Pt is also interested in supplements for cravings.

## 2017-09-15 ENCOUNTER — Ambulatory Visit: Payer: 59

## 2017-12-09 DIAGNOSIS — R05 Cough: Secondary | ICD-10-CM | POA: Diagnosis not present

## 2017-12-09 DIAGNOSIS — J069 Acute upper respiratory infection, unspecified: Secondary | ICD-10-CM | POA: Diagnosis not present

## 2017-12-09 DIAGNOSIS — J209 Acute bronchitis, unspecified: Secondary | ICD-10-CM | POA: Diagnosis not present

## 2018-03-14 DIAGNOSIS — R1030 Lower abdominal pain, unspecified: Secondary | ICD-10-CM | POA: Diagnosis not present

## 2018-03-14 DIAGNOSIS — N898 Other specified noninflammatory disorders of vagina: Secondary | ICD-10-CM | POA: Diagnosis not present

## 2018-03-14 DIAGNOSIS — R300 Dysuria: Secondary | ICD-10-CM | POA: Diagnosis not present

## 2018-03-16 ENCOUNTER — Other Ambulatory Visit: Payer: Self-pay | Admitting: Family Medicine

## 2018-03-16 DIAGNOSIS — N938 Other specified abnormal uterine and vaginal bleeding: Secondary | ICD-10-CM

## 2018-03-16 DIAGNOSIS — R1030 Lower abdominal pain, unspecified: Secondary | ICD-10-CM

## 2018-05-19 DIAGNOSIS — D485 Neoplasm of uncertain behavior of skin: Secondary | ICD-10-CM | POA: Diagnosis not present

## 2018-05-19 DIAGNOSIS — L821 Other seborrheic keratosis: Secondary | ICD-10-CM | POA: Diagnosis not present

## 2018-05-19 DIAGNOSIS — D239 Other benign neoplasm of skin, unspecified: Secondary | ICD-10-CM | POA: Diagnosis not present

## 2018-05-20 ENCOUNTER — Other Ambulatory Visit: Payer: 59

## 2018-05-27 ENCOUNTER — Ambulatory Visit
Admission: RE | Admit: 2018-05-27 | Discharge: 2018-05-27 | Disposition: A | Discharge status: Home / Self Care | Payer: 59 | Source: Ambulatory Visit | Attending: Family Medicine | Admitting: Family Medicine

## 2018-05-27 DIAGNOSIS — N938 Other specified abnormal uterine and vaginal bleeding: Secondary | ICD-10-CM

## 2018-05-27 DIAGNOSIS — N939 Abnormal uterine and vaginal bleeding, unspecified: Secondary | ICD-10-CM | POA: Diagnosis not present

## 2018-05-27 DIAGNOSIS — R1030 Lower abdominal pain, unspecified: Secondary | ICD-10-CM

## 2018-06-21 DIAGNOSIS — D239 Other benign neoplasm of skin, unspecified: Secondary | ICD-10-CM | POA: Diagnosis not present

## 2018-06-27 DIAGNOSIS — B999 Unspecified infectious disease: Secondary | ICD-10-CM | POA: Diagnosis not present

## 2018-07-28 DIAGNOSIS — I10 Essential (primary) hypertension: Secondary | ICD-10-CM | POA: Diagnosis not present

## 2018-07-28 DIAGNOSIS — E785 Hyperlipidemia, unspecified: Secondary | ICD-10-CM | POA: Diagnosis not present

## 2018-07-28 DIAGNOSIS — E559 Vitamin D deficiency, unspecified: Secondary | ICD-10-CM | POA: Diagnosis not present

## 2018-08-02 ENCOUNTER — Encounter: Payer: Self-pay | Admitting: Obstetrics & Gynecology

## 2018-08-02 DIAGNOSIS — I10 Essential (primary) hypertension: Secondary | ICD-10-CM | POA: Diagnosis not present

## 2018-08-02 DIAGNOSIS — E785 Hyperlipidemia, unspecified: Secondary | ICD-10-CM | POA: Diagnosis not present

## 2018-08-02 DIAGNOSIS — E559 Vitamin D deficiency, unspecified: Secondary | ICD-10-CM | POA: Diagnosis not present

## 2018-09-05 DIAGNOSIS — M545 Low back pain: Secondary | ICD-10-CM | POA: Diagnosis not present

## 2018-09-05 DIAGNOSIS — M5417 Radiculopathy, lumbosacral region: Secondary | ICD-10-CM | POA: Diagnosis not present

## 2018-09-21 DIAGNOSIS — T50905A Adverse effect of unspecified drugs, medicaments and biological substances, initial encounter: Secondary | ICD-10-CM | POA: Insufficient documentation

## 2018-09-21 DIAGNOSIS — I10 Essential (primary) hypertension: Secondary | ICD-10-CM | POA: Insufficient documentation

## 2018-09-21 DIAGNOSIS — G4489 Other headache syndrome: Secondary | ICD-10-CM | POA: Insufficient documentation

## 2018-09-21 DIAGNOSIS — Z20828 Contact with and (suspected) exposure to other viral communicable diseases: Secondary | ICD-10-CM | POA: Insufficient documentation

## 2018-09-21 DIAGNOSIS — Z79899 Other long term (current) drug therapy: Secondary | ICD-10-CM | POA: Insufficient documentation

## 2018-09-21 DIAGNOSIS — E876 Hypokalemia: Secondary | ICD-10-CM | POA: Insufficient documentation

## 2018-09-22 ENCOUNTER — Emergency Department (HOSPITAL_COMMUNITY)
Admission: EM | Admit: 2018-09-22 | Discharge: 2018-09-22 | Disposition: A | Discharge status: Home / Self Care | Payer: Self-pay | Attending: Emergency Medicine | Admitting: Emergency Medicine

## 2018-09-22 ENCOUNTER — Other Ambulatory Visit: Payer: Self-pay

## 2018-09-22 ENCOUNTER — Encounter (HOSPITAL_COMMUNITY): Payer: Self-pay | Admitting: Emergency Medicine

## 2018-09-22 ENCOUNTER — Emergency Department (HOSPITAL_COMMUNITY): Payer: Self-pay

## 2018-09-22 DIAGNOSIS — G4489 Other headache syndrome: Secondary | ICD-10-CM

## 2018-09-22 DIAGNOSIS — T50905A Adverse effect of unspecified drugs, medicaments and biological substances, initial encounter: Secondary | ICD-10-CM

## 2018-09-22 DIAGNOSIS — E876 Hypokalemia: Secondary | ICD-10-CM

## 2018-09-22 LAB — CBC
HCT: 40.9 % (ref 36.0–46.0)
Hemoglobin: 14 g/dL (ref 12.0–15.0)
MCH: 29.4 pg (ref 26.0–34.0)
MCHC: 34.2 g/dL (ref 30.0–36.0)
MCV: 85.7 fL (ref 80.0–100.0)
Platelets: 329 10*3/uL (ref 150–400)
RBC: 4.77 MIL/uL (ref 3.87–5.11)
RDW: 11.9 % (ref 11.5–15.5)
WBC: 9.3 10*3/uL (ref 4.0–10.5)
nRBC: 0 % (ref 0.0–0.2)

## 2018-09-22 LAB — COMPREHENSIVE METABOLIC PANEL
ALT: 30 U/L (ref 0–44)
AST: 20 U/L (ref 15–41)
Albumin: 4.1 g/dL (ref 3.5–5.0)
Alkaline Phosphatase: 65 U/L (ref 38–126)
Anion gap: 13 (ref 5–15)
BUN: 17 mg/dL (ref 6–20)
CO2: 24 mmol/L (ref 22–32)
Calcium: 9.5 mg/dL (ref 8.9–10.3)
Chloride: 100 mmol/L (ref 98–111)
Creatinine, Ser: 0.93 mg/dL (ref 0.44–1.00)
GFR calc Af Amer: >60 mL/min (ref >60)
GFR calc non Af Amer: >60 mL/min (ref >60)
Glucose, Bld: 123 mg/dL — ABNORMAL HIGH (ref 70–99)
Potassium: 3.3 mmol/L — ABNORMAL LOW (ref 3.5–5.1)
Sodium: 137 mmol/L (ref 135–145)
Total Bilirubin: 0.8 mg/dL (ref 0.3–1.2)
Total Protein: 7.2 g/dL (ref 6.5–8.1)

## 2018-09-22 LAB — URINALYSIS, ROUTINE W REFLEX MICROSCOPIC
Bilirubin Urine: NEGATIVE
Glucose, UA: NEGATIVE mg/dL
Hgb urine dipstick: NEGATIVE
Ketones, ur: NEGATIVE mg/dL
Leukocytes,Ua: NEGATIVE
Nitrite: NEGATIVE
Protein, ur: NEGATIVE mg/dL
Specific Gravity, Urine: 1.015 (ref 1.005–1.030)
pH: 5 (ref 5.0–8.0)

## 2018-09-22 MED ORDER — METOCLOPRAMIDE HCL 5 MG/ML IJ SOLN
10.0000 mg | Freq: Once | INTRAMUSCULAR | Status: AC
  Administered 2018-09-22: 10 mg via INTRAVENOUS
  Filled 2018-09-22: qty 2

## 2018-09-22 MED ORDER — ONDANSETRON 4 MG PO TBDP
4.0000 mg | ORAL_TABLET | Freq: Once | ORAL | Status: AC | PRN
  Administered 2018-09-22: 4 mg via ORAL
  Filled 2018-09-22: qty 1

## 2018-09-22 MED ORDER — KETOROLAC TROMETHAMINE 30 MG/ML IJ SOLN
30.0000 mg | Freq: Once | INTRAMUSCULAR | Status: AC
  Administered 2018-09-22: 30 mg via INTRAVENOUS
  Filled 2018-09-22: qty 1

## 2018-09-22 MED ORDER — DIPHENHYDRAMINE HCL 50 MG/ML IJ SOLN
25.0000 mg | Freq: Once | INTRAMUSCULAR | Status: AC
  Administered 2018-09-22: 25 mg via INTRAVENOUS
  Filled 2018-09-22: qty 1

## 2018-09-22 MED ORDER — POTASSIUM CHLORIDE CRYS ER 20 MEQ PO TBCR
40.0000 meq | EXTENDED_RELEASE_TABLET | Freq: Once | ORAL | Status: AC
  Administered 2018-09-22: 40 meq via ORAL
  Filled 2018-09-22: qty 2

## 2018-09-22 MED ORDER — POTASSIUM CHLORIDE ER 20 MEQ PO TBCR: 20.0000 meq | EXTENDED_RELEASE_TABLET | Freq: Every day | ORAL | 0 refills | Status: DC

## 2018-09-22 NOTE — ED Notes (Signed)
Discharge instructions discussed with pt. Pt verbalized understanding. Pt stable and ambulatory. No signature pad available. 

## 2018-09-22 NOTE — ED Triage Notes (Signed)
Patient here with headache and high blood pressure.  She is having circumoral numbness in her face.  She states she stopped taking gabapentin without tapering.  She did vomit x1 before coming to ED.

## 2018-09-22 NOTE — Discharge Instructions (Addendum)
Take 3 pills of gabapentin for 3 days Then take 2 pills of gabapentin for 3 days Then take one tablet daily until medicine is finished  Hold your lexapro until you finish gabapentin    You are having a headache. No specific cause was found today for your headache. It may have been a migraine or other cause of headache. Stress, anxiety, fatigue, and depression are common triggers for headaches. Your headache today does not appear to be life-threatening or require hospitalization, but often the exact cause of headaches is not determined in the emergency department. Therefore, follow-up with your doctor is very important to find out what may have caused your headache, and whether or not you need any further diagnostic testing or treatment. Sometimes headaches can appear benign (not harmful), but then more serious symptoms can develop which should prompt an immediate re-evaluation by your doctor or the emergency department.  SEEK MEDICAL ATTENTION IF:  You develop possible problems with medications prescribed.  The medications don't resolve your headache, if it recurs , or if you have multiple episodes of vomiting or can't take fluids. You have a change from the usual headache.  RETURN IMMEDIATELY IF you develop a sudden, severe headache or confusion, become poorly responsive or faint, develop a fever above 100.36F or problem breathing, have a change in speech, vision, swallowing, or understanding, or develop new weakness, numbness, tingling, incoordination, or have a seizure.

## 2018-09-22 NOTE — ED Provider Notes (Addendum)
[facility] EMERGENCY DEPARTMENT Provider Note   CSN: [id number] Arrival date & time: 09/21/18  2358    History   Chief Complaint Chief Complaint  Patient presents with  . Headache  . Numbness    HPI Lori Luna is a 53 y.o. female.     The history is provided by the patient.  Headache  Pain location:  Generalized Quality:  Dull Onset quality:  Gradual Timing:  Constant Progression:  Worsening Chronicity:  New Relieved by:  Nothing Worsened by:  Nothing Associated symptoms: vomiting   Associated symptoms: no cough, no fever and no focal weakness   Patient presents with headache.  She reports approximately 3 days ago she had onset of headache.  She reports it felt like a sinus type headache but now is throughout her whole head.  No fevers, but she has had nausea and vomiting She reports she has numbness in her mouth.  She reports feeling very anxious now she is having numbness in her hands.  No focal weakness. She reports she has been on tramadol, ibuprofen, and gabapentin over the past 2 weeks for back pain.  She reports she recently stopped gabapentin and tramadol.  She is wondering if this is related to her headaches. Past Medical History:  Diagnosis Date  . Atypical chest pain 06/19/2016  . Essential hypertension 06/19/2016  . Hyperlipidemia   . Hyperlipidemia 06/19/2016  . Hypertension     Patient Active Problem List   Diagnosis Date Noted  . Atypical chest pain 06/19/2016  . Essential hypertension 06/19/2016  . Hyperlipidemia 06/19/2016    Past Surgical History:  Procedure Laterality Date  . GALLBLADDER SURGERY       OB History   No obstetric history on file.      Home Medications    Prior to Admission medications   Medication Sig Start Date End Date Taking? Authorizing Provider  Enalapril-Hydrochlorothiazide 5-12.5 MG per tablet Take 1 tablet by mouth daily. 05/02/13   Jettie Booze, MD  escitalopram (LEXAPRO) 10 MG  tablet  07/26/17   [provider]  MAGNESIUM PO Take 1 capsule by mouth daily.    [provider]  meclizine (ANTIVERT) 25 MG tablet Take 25 mg by mouth 3 (three) times daily as needed for dizziness.    [provider]  pyridoxine (B-6) 100 MG tablet Take 100 mg by mouth daily.    [provider]    Family History Family History  Problem Relation Age of Onset  . Hypertension Mother   . Hypertension Father   . CAD Father   . Diabetes Maternal Grandmother   . CAD Maternal Grandmother   . Cancer Paternal Grandmother   . Cancer Paternal Grandfather   . Stroke Maternal Aunt   . Breast cancer Neg Hx     Social History Social History   Tobacco Use  . Smoking status: Never Smoker  . Smokeless tobacco: Never Used  Substance Use Topics  . Alcohol use: Yes  . Drug use: No     Allergies   Amoxicillin-pot clavulanate   Review of Systems Review of Systems  Constitutional: Negative for fever.  Eyes: Negative for visual disturbance.  Respiratory: Negative for cough.   Gastrointestinal: Positive for vomiting.  Neurological: Positive for headaches. Negative for focal weakness.  Psychiatric/Behavioral: The patient is nervous/anxious.   All other systems reviewed and are negative.    Physical Exam Updated Vital Signs BP (!) 153/89   Pulse 73  Temp 98.5 F (36.9 C) (Oral)   Resp 16   SpO2 100%   Physical Exam CONSTITUTIONAL: Well developed/well nourished HEAD: Normocephalic/atraumatic EYES: EOMI/PERRL, no nystagmus, no ptosis ENMT: Mucous membranes moist NECK: supple no meningeal signs, no bruits SPINE/BACK:entire spine nontender CV: S1/S2 noted, no murmurs/rubs/gallops noted LUNGS: Lungs are clear to auscultation bilaterally, no apparent distress ABDOMEN: soft, nontender, no rebound or guarding GU:no cva tenderness NEURO:Awake/alert, face symmetric, no arm or leg drift is noted Equal 5/5 strength with shoulder abduction, elbow  flex/extension, wrist flex/extension in upper extremities and equal hand grips bilaterally Equal 5/5 strength with hip flexion,knee flex/extension, foot dorsi/plantar flexion Cranial nerves 3/4/5/6/10/26/08/11/12 tested and intact Gait normal without ataxia No past pointing Sensation to light touch intact in all extremities EXTREMITIES: pulses normal, full ROM SKIN: warm, color normal PSYCH: no abnormalities of mood noted, alert and oriented to situation    ED Treatments / Results  Labs (all labs ordered are listed, but only abnormal results are displayed) Labs Reviewed  COMPREHENSIVE METABOLIC PANEL - Abnormal; Notable for the following components:      Result Value   Potassium 3.3 (*)    Glucose, Bld 123 (*)    All other components within normal limits  NOVEL CORONAVIRUS, NAA (HOSPITAL ORDER, SEND-OUT TO REF LAB)  CBC  URINALYSIS, ROUTINE W REFLEX MICROSCOPIC    EKG EKG Interpretation  Date/Time:  Thursday September 22 2018 04:42:53 EDT Ventricular Rate:  71 PR Interval:    QRS Duration: 97 QT Interval:  548 QTC Calculation: 596 R Axis:   29 Text Interpretation:  Sinus rhythm Borderline T abnormalities, anterior leads Prolonged QT interval Abnormal ekg Confirmed by Ripley Fraise [phone]) on 09/22/2018 4:46:16 AM   Radiology Ct Head Wo Contrast  Result Date: 09/22/2018 CLINICAL DATA:  Intracranial hemorrhage suspected. EXAM: CT HEAD WITHOUT CONTRAST TECHNIQUE: Contiguous axial images were obtained from the base of the skull through the vertex without intravenous contrast. COMPARISON:  None. FINDINGS: Brain: No evidence of acute infarction, hemorrhage, hydrocephalus, extra-axial collection or mass lesion/mass effect. Vascular: No hyperdense vessel or unexpected calcification. Skull: Normal. Negative for fracture or focal lesion. Sinuses/Orbits: No acute finding. Other: None. IMPRESSION: Normal study. Electronically Signed   By: Constance Holster M.D.   On: 09/22/2018 01:09     Procedures Procedures   Medications Ordered in ED Medications  ondansetron (ZOFRAN-ODT) disintegrating tablet 4 mg (4 mg Oral Given 09/22/18 0022)  metoCLOPramide (REGLAN) injection 10 mg (10 mg Intravenous Given 09/22/18 0518)  diphenhydrAMINE (BENADRYL) injection 25 mg (25 mg Intravenous Given 09/22/18 0519)  potassium chloride SA (K-DUR) CR tablet 40 mEq (40 mEq Oral Given 09/22/18 0524)  ketorolac (TORADOL) 30 MG/ML injection 30 mg (30 mg Intravenous Given 09/22/18 4010)     Initial Impression / Assessment and Plan / ED Course  I have reviewed the triage vital signs and the nursing notes.  Pertinent labs & imaging results that were available during my care of the patient were reviewed by me and considered in my medical decision making (see chart for details).       4:42 AM Patient presents with headache, feeling anxious, numbness in her mouth.  She has no focal neuro deficits.  Suspect this may be related to abrupt cessation of gabapentin/tramadol No focal neuro deficits Low suspicion for CVA/SAH at this time She now thinks she was exposed to COVID 19 Prolonged QT on EKG - will stop tramadol 5:06 AM Plan Will be to taper down gabapentin. Stop tramadol 6:34 AM Patient  appears improved.  Blood pressure improved. She is in no acute distress.  My suspicion for acute neurologic emergency is low.  Suspect headache was exacerbated by gabapentin withdrawal. She also had mild hypokalemia. Due to prolongation of QT, patient will hold Lexapro and stop tramadol.  She reports she has been off Lexapro since May 18. We will also give course of potassium as an outpatient.  I instructed patient to follow-up as an outpatient for recheck of electrolytes.   Lori Luna was evaluated in Emergency Department on 09/22/2018 for the symptoms described in the history of present illness. She was evaluated in the context of the global COVID-19 pandemic, which necessitated consideration that the patient  might be at risk for infection with the SARS-CoV-2 virus that causes COVID-19. Institutional protocols and algorithms that pertain to the evaluation of patients at risk for COVID-19 are in a state of rapid change based on information released by regulatory bodies including the CDC and federal and state organizations. These policies and algorithms were followed during the patient's care in the ED.   Final Clinical Impressions(s) / ED Diagnoses   Final diagnoses:  Other headache syndrome  Adverse effect of drug, initial encounter  Hypokalemia    ED Discharge Orders         Ordered    potassium chloride 20 MEQ TBCR  Daily     09/22/18 0610           Ripley Fraise, MD 09/22/18 2094    Ripley Fraise, MD 09/22/18 [phone]

## 2018-09-26 LAB — NOVEL CORONAVIRUS, NAA (HOSP ORDER, SEND-OUT TO REF LAB; TAT 18-24 HRS): SARS-CoV-2, NAA: NOT DETECTED

## 2018-12-14 ENCOUNTER — Encounter: Payer: Self-pay | Admitting: Obstetrics & Gynecology

## 2019-02-13 ENCOUNTER — Encounter: Payer: Self-pay | Admitting: Cardiovascular Disease

## 2019-02-13 ENCOUNTER — Ambulatory Visit: Payer: 59 | Admitting: Cardiovascular Disease

## 2019-02-13 ENCOUNTER — Other Ambulatory Visit: Payer: Self-pay

## 2019-02-13 VITALS — BP 142/86 | HR 80 | Ht 66.0 in | Wt 230.4 lb

## 2019-02-13 DIAGNOSIS — I10 Essential (primary) hypertension: Secondary | ICD-10-CM

## 2019-02-13 DIAGNOSIS — R635 Abnormal weight gain: Secondary | ICD-10-CM | POA: Diagnosis not present

## 2019-02-13 DIAGNOSIS — Z5181 Encounter for therapeutic drug level monitoring: Secondary | ICD-10-CM

## 2019-02-13 MED ORDER — OLMESARTAN MEDOXOMIL-HCTZ 40-12.5 MG PO TABS: 1.0000 | ORAL_TABLET | Freq: Every day | ORAL | 3 refills | Status: DC

## 2019-02-13 NOTE — Patient Instructions (Addendum)
Medication Instructions:  INCREASE OLMASARTAN HCT TO 40-12.5 MG DAILY   *If you need a refill on your cardiac medications before your next appointment, please call your pharmacy*  Lab Work: BMET/TSH/FT4 IN 1 WEEK  If you have labs (blood work) drawn today and your tests are completely normal, you will receive your results only by: Marland Kitchen MyChart Message (if you have MyChart) OR . A paper copy in the mail If you have any lab test that is abnormal or we need to change your treatment, we will call you to review the results.  Testing/Procedures: NONE  Follow-Up: At [facility], you and your health needs are our priority.  As part of our continuing mission to provide you with exceptional heart care, we have created designated Provider Care Teams.  These Care Teams include your primary Cardiologist (physician) and Advanced Practice Providers (APPs -  Physician Assistants and Nurse Practitioners) who all work together to provide you with the care you need, when you need it.  Your next appointment:   Your physician recommends that you schedule a follow-up appointment in: 1 [place] D   4 months Provider:  You may see Skeet Latch, MD or one of the following Advanced Practice Providers on your designated Care Team:    Kerin Ransom, PA-C  [place], [place]  [name], [place]  The format for your next appointment:   Either In Person or Virtual   You have been referred to: [facility] Address: [place], [address] [place] [address] Phone: [phone]  IF YOU DO NOT HEAR FROM THEM BY NEXT [place]

## 2019-02-13 NOTE — Progress Notes (Signed)
Cardiology Office Note   Date:  02/13/2019   ID:  Lori, Luna January 08, 1966, MRN [id number]  PCP:  Cari Caraway, MD  Cardiologist:   Skeet Latch, MD   No chief complaint on file.    History of Present Illness: Lori Luna is a 53 y.o. female with hypertension and hyperlipidemia who presents for follow-up.  Lori Luna was initially seen 06/2016 for cardiovascular risk assessment.  She has a family history of CAD and had some episodes of atypical chest pain.  She was referred for coronary CT-a 07/2016 that revealed a coronary calcium score of 0.  At her last appointment Lori Luna was struggling with depression.  She has started on Lexapro which she thinks has helped.  Her blood pressure has continued to be elevated.  Her PCP switched enalapril to olmesartan 20 mg.  Prior to that change she had a cough that has since improved.  Since making that change she has noticed that her diastolic blood pressure stays mostly under 80.  However her systolic is been in the 0000000 to 150s.  She has been mostly feeling well.  She was hospitalized for withdrawal from gabapentin.  Since her last appointment Lori Luna lost her job in June.  She started walking 3 to 4 miles most days of the week.  Her diet has been good.  However she has been unable to lose weight.  She attributes this to menopause.  She plans to follow-up with her gynecologist soon.   Past Medical History:  Diagnosis Date  . Atypical chest pain 06/19/2016  . Essential hypertension 06/19/2016  . Hyperlipidemia   . Hyperlipidemia 06/19/2016  . Hypertension     Past Surgical History:  Procedure Laterality Date  . GALLBLADDER SURGERY       Current Outpatient Medications  Medication Sig Dispense Refill  . escitalopram (LEXAPRO) 10 MG tablet Take 10 mg by mouth daily.     Marland Kitchen ibuprofen (ADVIL) 800 MG tablet Take 800 mg by mouth 3 (three) times daily.    . meclizine (ANTIVERT) 25 MG tablet Take 25 mg by mouth 3  (three) times daily as needed for dizziness.    . potassium chloride 20 MEQ TBCR Take 20 mEq by mouth daily. 5 tablet 0  . traMADol (ULTRAM) 50 MG tablet Take 50 mg by mouth every 6 (six) hours as needed for pain.    Marland Kitchen olmesartan-hydrochlorothiazide (BENICAR HCT) 40-12.5 MG tablet Take 1 tablet by mouth daily. 90 tablet 3   No current facility-administered medications for this visit.     Allergies:   Amoxicillin-pot clavulanate    Social History:  The patient  reports that she has never smoked. She has never used smokeless tobacco. She reports current alcohol use. She reports that she does not use drugs.   Family History:  The patient's family history includes CAD in her father and maternal grandmother; Cancer in her paternal grandfather and paternal grandmother; Diabetes in her maternal grandmother; Hypertension in her father and mother; Stroke in her maternal aunt.    ROS:  Please see the history of present illness.   Otherwise, review of systems are positive for none.   All other systems are reviewed and negative.    PHYSICAL EXAM: VS:  BP (!) 142/86   Pulse 80   Ht 5\' 6"  (1.676 m)   Wt 230 lb 6.4 oz (104.5 kg)   SpO2 99%   BMI 37.19 kg/m  , BMI Body mass index is 37.19  kg/m. GENERAL:  Well appearing HEENT: Pupils equal round and reactive, fundi not visualized, oral mucosa unremarkable NECK:  No jugular venous distention, waveform within normal limits, carotid upstroke brisk and symmetric, no bruits, no thyromegaly LYMPHATICS:  No cervical adenopathy LUNGS:  Clear to auscultation bilaterally HEART:  RRR.  PMI not displaced or sustained,S1 and S2 within normal limits, no S3, no S4, no clicks, no rubs, no murmurs ABD:  Flat, positive bowel sounds normal in frequency in pitch, no bruits, no rebound, no guarding, no midline pulsatile mass, no hepatomegaly, no splenomegaly EXT:  2 plus pulses throughout, no edema, no cyanosis no clubbing SKIN:  No rashes no nodules NEURO:  Cranial  nerves II through XII grossly intact, motor grossly intact throughout PSYCH:  Cognitively intact, oriented to person place and time    EKG:  EKG is ordered today. The ekg ordered 06/19/16 demonstrates sinus rhythm rate 76 bpm.  08/19/2017: Sinus rhythm.  Rate 77 bpm.  Coronary CT-A 07/29/16: 1. Coronary calcium score of 0. This was 0 percentile for age and sex matched control.  2. Normal coronary origin with right dominance.  3. No evidence of CAD.  Recent Labs: 09/22/2018: ALT 30; BUN 17; Creatinine, Ser 0.93; Hemoglobin 14.0; Platelets 329; Potassium 3.3; Sodium 137    Lipid Panel No results found for: CHOL, TRIG, HDL, CHOLHDL, VLDL, LDLCALC, LDLDIRECT   06/08/16: Total cholesterol 220, triglycerides 175, HDL 56, LDL 129 Sodium 141, potassium 3.8, BUN 17, creatinine 0.1 AST 16, ALT 24  08/02/2018: Total cholesterol 210, triglycerides 166, HDL 49, LDL 129  Wt Readings from Last 3 Encounters:  02/13/19 230 lb 6.4 oz (104.5 kg)  08/19/17 223 lb (101.2 kg)  06/19/16 216 lb (98 kg)      ASSESSMENT AND PLAN:  # Chest pain: Resolved.  She had a coronary calcium score of 07/2016.    # Hypertension: BP remains elevated.  Increase telmisartan to 40 mg.  Continue HCTZ 12.5 mg.  She does not want to increase the dose of her diuretic due to urinary incontinence since pregnancy.  She will check her blood pressure and follow-up with our pharmacist.  Check BMP in 1 week.  # Hyperlipidemia: ASCVD 10 year risk is 3.2%.  Diet and exercise as above.  # Morbid obesity: She is struggling to lose weight despite eating better and increasing her exercise.  We will refer her to the healthy weight and wellness clinic.  Check TSH and free T4.  Current medicines are reviewed at length with the patient today.  The patient does not have concerns regarding medicines.  The following changes have been made: Increase olmesartan to 40 mg.  Labs/ tests ordered today include:   Orders Placed This  Encounter  Procedures  . T4, free  . TSH  . Basic metabolic panel  . Ambulatory referral to Family Practice     Disposition:   FU with Johannes Everage C. Oval Linsey, MD, [facility] in 4 months.  Pharm.D. in 1 month.    Signed, Alekxander Isola C. Oval Linsey, MD, [facility]  02/13/2019 1:04 PM    [facility]

## 2019-03-21 ENCOUNTER — Ambulatory Visit: Payer: 59

## 2019-03-21 NOTE — Progress Notes (Deleted)
Patient ID: Lori Luna                 DOB: January 15, 1966                      MRN: [id number]     HPI:  Lori Luna is a 53 y.o. female referred by Dr. Oval Linsey to HTN clinic. PMH includes atypical chest pain, hypertension, and hyperlipidemia.   Current HTN meds: olmesartan/HCTZ 40-12.5mg  daily  BP goal: <130/80  Family History: The patient's family history includes CAD in her father and maternal grandmother; Cancer in her paternal grandfather and paternal grandmother; Diabetes in her maternal grandmother; Hypertension in her father and mother; Stroke in her maternal aunt.  Social History: The patient  reports that she has never smoked. She has never used smokeless tobacco. She reports current alcohol use. She reports that she does not use drugs.   Diet:   Exercise:   Home BP readings:   Wt Readings from Last 3 Encounters:  02/13/19 230 lb 6.4 oz (104.5 kg)  08/19/17 223 lb (101.2 kg)  06/19/16 216 lb (98 kg)   BP Readings from Last 3 Encounters:  02/13/19 (!) 142/86  09/22/18 135/73  08/19/17 126/86   Pulse Readings from Last 3 Encounters:  02/13/19 80  09/22/18 76  08/19/17 77    Renal function: CrCl cannot be calculated (Patient's most recent lab result is older than the maximum 21 days allowed.).  Past Medical History:  Diagnosis Date  . Atypical chest pain 06/19/2016  . Essential hypertension 06/19/2016  . Hyperlipidemia   . Hyperlipidemia 06/19/2016  . Hypertension     Current Outpatient Medications on File Prior to Visit  Medication Sig Dispense Refill  . escitalopram (LEXAPRO) 10 MG tablet Take 10 mg by mouth daily.     Marland Kitchen ibuprofen (ADVIL) 800 MG tablet Take 800 mg by mouth 3 (three) times daily.    . meclizine (ANTIVERT) 25 MG tablet Take 25 mg by mouth 3 (three) times daily as needed for dizziness.    Marland Kitchen olmesartan-hydrochlorothiazide (BENICAR HCT) 40-12.5 MG tablet Take 1 tablet by mouth daily. 90 tablet 3  . potassium chloride 20 MEQ TBCR  Take 20 mEq by mouth daily. 5 tablet 0  . traMADol (ULTRAM) 50 MG tablet Take 50 mg by mouth every 6 (six) hours as needed for pain.     No current facility-administered medications on file prior to visit.     Allergies  Allergen Reactions  . Amoxicillin-Pot Clavulanate Diarrhea    There were no vitals taken for this visit.  No problem-specific Assessment & Plan notes found for this encounter.    Lori Luna PharmD, BCPS, [facility] [address] [place] [address] 03/21/2019 8:36 AM

## 2019-03-24 ENCOUNTER — Other Ambulatory Visit: Payer: Self-pay

## 2019-03-24 DIAGNOSIS — Z20822 Contact with and (suspected) exposure to covid-19: Secondary | ICD-10-CM

## 2019-03-26 LAB — NOVEL CORONAVIRUS, NAA: SARS-CoV-2, NAA: NOT DETECTED

## 2019-04-24 ENCOUNTER — Other Ambulatory Visit: Payer: 59

## 2019-04-27 ENCOUNTER — Ambulatory Visit: Payer: 59 | Attending: Internal Medicine

## 2019-04-27 DIAGNOSIS — Z20822 Contact with and (suspected) exposure to covid-19: Secondary | ICD-10-CM

## 2019-04-29 LAB — NOVEL CORONAVIRUS, NAA: SARS-CoV-2, NAA: NOT DETECTED

## 2019-05-02 NOTE — Progress Notes (Signed)
54 y.o. No obstetric history on file. Married White or Caucasian female here for annual exam.  This is her first visit her.  Has been seeing Dr. Leonides Schanz for her gyn needs.  Menopause has been frustrating for her especially due to her weight gain.    Last menstrual cycle was in July, 2020, but this was light and short.  Her last "normal" cycle was about a year ago.    Third delivery was more difficult and now she is having issues with urinary leakage with cough, sneezing, laughing and anything that increases pressure on her bladder.   She reports the last two years have been hard for her so she has been on Lexapro.    She has been trying to exercise more regularly.  She has been trying to work on diet.    Has been experiencing significant joint pain, hip pain that has been worsening for the past several years.  She has not been diagnosed with arthritis.    No LMP recorded. Patient is postmenopausal.          Sexually active: Yes.    The current method of family planning is post menopausal status.    Exercising: Yes.    walking Smoker:  no  Health Maintenance: Pap:  2019 History of abnormal Pap:  no MMG:  06/2017 Colonoscopy:  Has appt scheduled 05/17/2019 BMD:   In Dr. Kennith Maes office TDaP:  She thinks this is UTD Pneumonia vaccine(s):  Not indicated Shingrix:   Thinks she's had the chicken pox vaccine Hep C testing: not indicated Screening Labs: will plan today   reports that she has never smoked. She has never used smokeless tobacco. She reports current alcohol use. She reports that she does not use drugs.  Past Medical History:  Diagnosis Date  . Atypical chest pain 06/19/2016  . Essential hypertension 06/19/2016  . Hyperlipidemia   . Hyperlipidemia 06/19/2016  . Hypertension     Past Surgical History:  Procedure Laterality Date  . GALLBLADDER SURGERY      Current Outpatient Medications  Medication Sig Dispense Refill  . escitalopram (LEXAPRO) 10 MG tablet Take 10 mg by mouth  daily.     Marland Kitchen ibuprofen (ADVIL) 800 MG tablet Take 800 mg by mouth 3 (three) times daily.    . meclizine (ANTIVERT) 25 MG tablet Take 25 mg by mouth 3 (three) times daily as needed for dizziness.    Marland Kitchen olmesartan-hydrochlorothiazide (BENICAR HCT) 40-12.5 MG tablet Take 1 tablet by mouth daily. 90 tablet 3  . potassium chloride 20 MEQ TBCR Take 20 mEq by mouth daily. 5 tablet 0  . traMADol (ULTRAM) 50 MG tablet Take 50 mg by mouth every 6 (six) hours as needed for pain.     No current facility-administered medications for this visit.    Family History  Problem Relation Age of Onset  . Hypertension Mother   . Hypertension Father   . CAD Father   . Diabetes Maternal Grandmother   . CAD Maternal Grandmother   . Cancer Paternal Grandmother   . Cancer Paternal Grandfather   . Stroke Maternal Aunt   . Breast cancer Neg Hx     Review of Systems  Neurological: Negative.        Joint pain in lower half body  All other systems reviewed and are negative.   Exam:   Vitals:   05/04/19 0931  BP: 140/85  Pulse: 75  Temp: 97.9 F (36.6 C)   General appearance: alert, cooperative  and appears stated age Head: Normocephalic, without obvious abnormality, atraumatic Neck: no adenopathy, supple, symmetrical, trachea midline and thyroid normal to inspection and palpation Lungs: clear to auscultation bilaterally Breasts: normal appearance, no masses or tenderness Heart: regular rate and rhythm Abdomen: soft, non-tender; bowel sounds normal; no masses,  no organomegaly Extremities: extremities normal, atraumatic, no cyanosis or edema Skin: Skin color, texture, turgor normal. No rashes or lesions Lymph nodes: Cervical, supraclavicular, and axillary nodes normal. No abnormal inguinal nodes palpated Neurologic: Grossly normal   Pelvic: External genitalia:  no lesions              Urethra:  normal appearing urethra with no masses, tenderness or lesions              Bartholins and Skenes: normal                  Vagina: normal appearing vagina with normal color and discharge, no lesions              Cervix: no lesions              Pap taken: No. Bimanual Exam:  Uterus:  normal size, contour, position, consistency, mobility, non-tender              Adnexa: normal adnexa and no mass, fullness, tenderness               Rectovaginal: Confirms               Anus:  normal sphincter tone, no lesions  Chaperone, Bernell List, RN, was present for exam.  A:  Well Woman with normal exam Amenorrhea Joint pain Obesity SUI  P:   Mammogram guidelines reviewed.  Aware this is overdue.  She will schedule this. Colonoscopy appt is scheduled Release of records from Dr. Addison Lank signed today pap smear and HR HPV obtained today CBC, CMP, TSH, Free t4, HbA1C FSH obtained today ESR, RF, ANA obtained today return annually or prn

## 2019-05-04 ENCOUNTER — Telehealth: Payer: Self-pay | Admitting: Obstetrics & Gynecology

## 2019-05-04 ENCOUNTER — Encounter: Payer: Self-pay | Admitting: Obstetrics & Gynecology

## 2019-05-04 ENCOUNTER — Ambulatory Visit: Payer: 59 | Admitting: Obstetrics & Gynecology

## 2019-05-04 ENCOUNTER — Other Ambulatory Visit (HOSPITAL_COMMUNITY)
Admission: RE | Admit: 2019-05-04 | Discharge: 2019-05-04 | Disposition: A | Discharge status: Home / Self Care | Payer: 59 | Source: Ambulatory Visit | Attending: Obstetrics & Gynecology | Admitting: Obstetrics & Gynecology

## 2019-05-04 ENCOUNTER — Other Ambulatory Visit: Payer: Self-pay

## 2019-05-04 VITALS — BP 140/85 | HR 75 | Temp 97.9°F | Ht 65.5 in | Wt 231.6 lb

## 2019-05-04 DIAGNOSIS — Z124 Encounter for screening for malignant neoplasm of cervix: Secondary | ICD-10-CM | POA: Diagnosis not present

## 2019-05-04 DIAGNOSIS — Z01419 Encounter for gynecological examination (general) (routine) without abnormal findings: Secondary | ICD-10-CM

## 2019-05-04 DIAGNOSIS — N912 Amenorrhea, unspecified: Secondary | ICD-10-CM | POA: Diagnosis not present

## 2019-05-04 DIAGNOSIS — M2550 Pain in unspecified joint: Secondary | ICD-10-CM

## 2019-05-04 DIAGNOSIS — Z0000 Encounter for general adult medical examination without abnormal findings: Secondary | ICD-10-CM

## 2019-05-04 NOTE — Telephone Encounter (Signed)
Patient was seen today and had labs drawn for RA. She was told to call when she learned the specific testing that her mother had for RA.

## 2019-05-04 NOTE — Telephone Encounter (Signed)
Spoke with patient. Patient is f/u from [facility] today. Wants to make sure Rheumatoid factor is drawn. Reviewed labs collected, advised rheumatoid factor was drawn. Advised patient once results are back and reviewed by the provider our office will f/u to review. Patient agreeable.   Routing to provider for final review. Patient is agreeable to disposition. Will close encounter.

## 2019-05-05 LAB — CBC
Hematocrit: 43.5 % (ref 34.0–46.6)
Hemoglobin: 14.5 g/dL (ref 11.1–15.9)
MCH: 29.1 pg (ref 26.6–33.0)
MCHC: 33.3 g/dL (ref 31.5–35.7)
MCV: 87 fL (ref 79–97)
Platelets: 306 10*3/uL (ref 150–450)
RBC: 4.99 x10E6/uL (ref 3.77–5.28)
RDW: 12.5 % (ref 11.7–15.4)
WBC: 4.6 10*3/uL (ref 3.4–10.8)

## 2019-05-05 LAB — HEMOGLOBIN A1C
Est. average glucose Bld gHb Est-mCnc: 111 mg/dL
Hgb A1c MFr Bld: 5.5 % (ref 4.8–5.6)

## 2019-05-05 LAB — COMPREHENSIVE METABOLIC PANEL
ALT: 26 IU/L (ref 0–32)
AST: 21 IU/L (ref 0–40)
Albumin/Globulin Ratio: 1.8 (ref 1.2–2.2)
Albumin: 4.7 g/dL (ref 3.8–4.9)
Alkaline Phosphatase: 85 IU/L (ref 39–117)
BUN/Creatinine Ratio: 20 (ref 9–23)
BUN: 16 mg/dL (ref 6–24)
Bilirubin Total: 0.3 mg/dL (ref 0.0–1.2)
CO2: 25 mmol/L (ref 20–29)
Calcium: 9.8 mg/dL (ref 8.7–10.2)
Chloride: 102 mmol/L (ref 96–106)
Creatinine, Ser: 0.79 mg/dL (ref 0.57–1.00)
GFR calc Af Amer: 99 mL/min/{1.73_m2} (ref >59)
GFR calc non Af Amer: 86 mL/min/{1.73_m2} (ref >59)
Globulin, Total: 2.6 g/dL (ref 1.5–4.5)
Glucose: 90 mg/dL (ref 65–99)
Potassium: 3.9 mmol/L (ref 3.5–5.2)
Sodium: 140 mmol/L (ref 134–144)
Total Protein: 7.3 g/dL (ref 6.0–8.5)

## 2019-05-05 LAB — FOLLICLE STIMULATING HORMONE: FSH: 40.8 m[IU]/mL

## 2019-05-05 LAB — SEDIMENTATION RATE: Sed Rate: 14 mm/hr (ref 0–40)

## 2019-05-05 LAB — VITAMIN D 25 HYDROXY (VIT D DEFICIENCY, FRACTURES): Vit D, 25-Hydroxy: 29.6 ng/mL — ABNORMAL LOW (ref 30.0–100.0)

## 2019-05-05 LAB — CYTOLOGY - PAP
Comment: NEGATIVE
Diagnosis: NEGATIVE
High risk HPV: NEGATIVE

## 2019-05-05 LAB — TSH: TSH: 3.23 u[IU]/mL (ref 0.450–4.500)

## 2019-05-05 LAB — ANA: Anti Nuclear Antibody (ANA): NEGATIVE

## 2019-05-05 LAB — T4, FREE: Free T4: 1.05 ng/dL (ref 0.82–1.77)

## 2019-05-05 LAB — RHEUMATOID FACTOR: Rheumatoid fact SerPl-aCnc: <10 IU/mL (ref 0.0–13.9)

## 2019-05-12 ENCOUNTER — Telehealth: Payer: Self-pay | Admitting: Obstetrics & Gynecology

## 2019-05-12 NOTE — Telephone Encounter (Signed)
Patient is calling regarding questions about results.

## 2019-05-12 NOTE — Telephone Encounter (Signed)
Dr. Sabra Heck -please review 05/04/19 results and advise.

## 2019-05-14 NOTE — Telephone Encounter (Signed)
Please let her know that her lab work looked great.  Her CBC, CMP, thyroid testing were normal.  Vit D was right at the normal range.  [place] showed menopause.  If she has future bleeding, she should call.  ANA, sed rate, rhuematoid factor are normal.  At this point, rheumatologist is probably not indicated.  She should follow up with Dr. Addison Lank and see if she has any other suggestions about joint issues.  Her pap was normal and HR HPV testing was negative.

## 2019-05-15 NOTE — Telephone Encounter (Signed)
Her Vit D was almost 30 without any prescription Vit D.  Rx for this really not indicated but it is fine for her to take 1000 to 2000 IU daily.  She can purchase this OTC.

## 2019-05-15 NOTE — Telephone Encounter (Signed)
Spoke with patient. Advised per Dr. Sabra Heck.   1. Patient reports hot flashes, weight gain and join pain, request to discuss HRT and menopause.   2. Patient is requesting RX Vit D. Patient states she was prescribed this previously by PCP for low Vit D and it helped with joint pain. Not currently on any daily Vit D. Confirmed pharmacy, will review with Dr. Sabra Heck.   Recommended MyChart visit to further discuss menopause and HRT. MyChart visit scheduled for 1/28 at 4:30 pm.   Dr. Sabra Heck -please advise on Vit D.

## 2019-05-15 NOTE — Telephone Encounter (Signed)
Spoke with patient, advised per Dr. Miller. Patient verbalizes understanding and is agreeable. Encounter closed.  

## 2019-05-18 ENCOUNTER — Encounter: Payer: Self-pay | Admitting: Obstetrics & Gynecology

## 2019-05-18 ENCOUNTER — Telehealth (INDEPENDENT_AMBULATORY_CARE_PROVIDER_SITE_OTHER): Payer: 59 | Admitting: Obstetrics & Gynecology

## 2019-05-18 DIAGNOSIS — N951 Menopausal and female climacteric states: Secondary | ICD-10-CM

## 2019-05-18 DIAGNOSIS — E2839 Other primary ovarian failure: Secondary | ICD-10-CM

## 2019-05-18 DIAGNOSIS — Z6837 Body mass index (BMI) 37.0-37.9, adult: Secondary | ICD-10-CM

## 2019-05-18 DIAGNOSIS — M25559 Pain in unspecified hip: Secondary | ICD-10-CM

## 2019-05-18 NOTE — Progress Notes (Signed)
Virtual Visit via Video Note  I connected with Lori Luna on 05/18/19 at  4:30 PM EST by a video enabled telemedicine application and verified that I am speaking with the correct person using two identifiers.  Location: Patient: at her mother's home Provider: office   I discussed the limitations of evaluation and management by telemedicine and the availability of in person appointments. The patient expressed understanding and agreed to proceed.  History of Present Illness: 54 yo MWF here for conversation about recent lab work.  Had worsening foot pain and saw a provider for work-in and has a fracture in her metatarsal.  She is now in a boot.  Provider she saw thought some of her body pain was neurologic based and referred her to neurology.  Referral was made but she doesn't remember the name right now.    She is currently at her mother's in [place], [place], helping her pack up her home.  Mother is moving to the [place] area to be closer.  We discussed HRT use.  Risks including breast cancer, DVT, PE, stroke and MI discussed.  She did see Dr. Oval Linsey for atypical chest pain and had a cardiac evaluation including a coronary CT.  This was normal and her score was 0.  She does have hypertension but this is being treated.  After discussion, she feels this is not something she wants to take right now.  Lab work from 05/04/2019 visit discussed.  FSH was 40.  All other lab work was normal except Vit D was just under 30.  She's been prescribed Vit D in the past and would like it prescribed again.  This is not indicated.  Up to date guidelines about treatment with prescription Vit D discussed.  OTC is appropriate and options discussed.  Pt voiced understanding.    Pt aware I am concerned about her body pain issues and worsening pain any time she exercises or does anything physical.  She does have an ortho who has advised her she does not have arthritis.  I just don't have anything that clearly is the  cause of her pain.    Pt is aware that weight loss will likely help and has appt with Dr. Leafy Ro 06/01/2019.    Observations/Objective: WNWD WF, NAD  Assessment and Plan: Joint pain Recent metatarsal fracture Menopausal Family hx of psoriasis in father and psoriatic arthritis in son (diagnosed age 53)  Follow Up Instructions: For now, she is going to keep neurology appt I will communicate about possible rheumatology appt Will plan BMD.  Order placed.  Pt encouraged to keep Dr. Migdalia Dk appt  I discussed the assessment and treatment plan with the patient. The patient was provided an opportunity to ask questions and all were answered. The patient agreed with the plan and demonstrated an understanding of the instructions.   The patient was advised to call back or seek an in-person evaluation if the symptoms worsen or if the condition fails to improve as anticipated.  I provided 30 minutes of non-face-to-face time during this encounter.  Megan Salon, MD

## 2019-05-22 NOTE — Progress Notes (Signed)
Office Visit Note  Patient: Lori Luna             Date of Birth: May 26, 1965           MRN: [id number]             PCP: Cari Caraway, MD Referring: Cari Caraway, MD Visit Date: 05/30/2019 Occupation: @GUAROCC @  Subjective:  No chief complaint on file.   History of Present Illness: Madelyn Tlatelpa is a 54 y.o. female seen in consultation per request of Dr. Sabra Heck.  According to patient she has had lower back pain for last 1-1/2-year.  She has been diagnosed with degenerative disc disease of lumbar spine.  She states the lower back pain sometimes radiates down into her lower extremities.  In the last year she gained about 30 pounds of weight.  She states she has been experiencing increased lower extremity pain since then and she has been relating it to weight gain.  In July 2020 she states she had injury to her right hamstring which lasted for about couple of months and then resolved.  Since October 2020 she has been experiencing increased lower back pain and increased pain in her bilateral lower extremities which she describes in all of her joints and her muscles.  She has noticed swelling in her feet.  Recently she has been experiencing some discomfort in her bilateral elbows which she describes over her lateral epicondyle area.  She states she was helping her mother back and that caused some discomfort in her right knee joint.  None of the other joints are painful.  She states she has been walking from September to November.  And she started experiencing pain in her left foot.  Due to persistent pain in her foot she went to see Dr. Gershon Mussel, podiatrist.  He did x-rays and ultrasound of her foot and diagnosed her with a metatarsal fracture.  She has been wearing a boot now.  Activities of Daily Living:  Patient reports morning stiffness for 30 minutes.   Patient Reports nocturnal pain.  Difficulty dressing/grooming: Reports Difficulty climbing stairs: Reports Difficulty  getting out of chair: Reports Difficulty using hands for taps, buttons, cutlery, and/or writing: Denies  Review of Systems  Constitutional: Positive for fatigue. Negative for night sweats, weight gain and weight loss.  HENT: Negative for mouth sores, trouble swallowing, trouble swallowing, mouth dryness and nose dryness.   Eyes: Negative for pain, redness, itching, visual disturbance and dryness.  Respiratory: Negative for cough, shortness of breath, wheezing and difficulty breathing.   Cardiovascular: Negative for chest pain, palpitations, hypertension, irregular heartbeat and swelling in legs/feet.  Gastrointestinal: Negative for blood in stool, constipation and diarrhea.  Endocrine: Negative for increased urination.  Genitourinary: Negative for difficulty urinating, painful urination and vaginal dryness.  Musculoskeletal: Positive for arthralgias, joint pain, myalgias, morning stiffness, muscle tenderness and myalgias. Negative for joint swelling and muscle weakness.  Skin: Positive for rash. Negative for color change, hair loss, skin tightness, ulcers and sensitivity to sunlight.  Allergic/Immunologic: Negative for susceptible to infections.  Neurological: Positive for weakness. Negative for dizziness, numbness, headaches, memory loss and night sweats.  Hematological: Negative for bruising/bleeding tendency and swollen glands.  Psychiatric/Behavioral: Negative for depressed mood, confusion and sleep disturbance. The patient is not nervous/anxious.     PMFS History:  Patient Active Problem List   Diagnosis Date Noted  . Atypical chest pain 06/19/2016  . Essential hypertension 06/19/2016  . Hyperlipidemia 06/19/2016    Past Medical History:  Diagnosis Date  . Atypical chest pain 06/19/2016  . Essential hypertension 06/19/2016  . Family history of degenerative disc disease   . Hyperlipidemia   . Hyperlipidemia 06/19/2016  . Hypertension     Family History  Problem Relation Age of  Onset  . Hypertension Mother   . Hypertension Father   . CAD Father   . Psoriasis Father   . Diabetes Maternal Grandmother   . CAD Maternal Grandmother   . Cancer Paternal Grandmother   . Cancer Paternal Grandfather   . Stroke Maternal Aunt   . Healthy Brother   . Healthy Son   . Healthy Son   . Autoimmune disease Son   . Psoriasis Son   . Vitiligo Son   . Breast cancer Neg Hx    Past Surgical History:  Procedure Laterality Date  . GALLBLADDER SURGERY     Social History   Social History Narrative  . Not on file   Immunization History  Administered Date(s) Administered  . Influenza-Unspecified 02/02/2019  . Tdap 04/30/2014     Objective: Vital Signs: BP (!) 158/101 (BP Location: Left Arm, Patient Position: Sitting, Cuff Size: Normal)   Pulse 75   Resp 15   Ht 5' 5.5" (1.664 m)   Wt 231 lb (104.8 kg) Comment: per patient  LMP 10/19/2018 (Exact Date)   BMI 37.86 kg/m    Physical Exam Vitals and nursing note reviewed.  Constitutional:      Appearance: She is well-developed.  HENT:     Head: Normocephalic and atraumatic.  Eyes:     Conjunctiva/sclera: Conjunctivae normal.  Cardiovascular:     Rate and Rhythm: Normal rate and regular rhythm.     Heart sounds: Normal heart sounds.  Pulmonary:     Effort: Pulmonary effort is normal.     Breath sounds: Normal breath sounds.  Abdominal:     General: Bowel sounds are normal.     Palpations: Abdomen is soft.  Musculoskeletal:     Cervical back: Normal range of motion.  Lymphadenopathy:     Cervical: No cervical adenopathy.  Skin:    General: Skin is warm and dry.     Capillary Refill: Capillary refill takes less than 2 seconds.  Neurological:     Mental Status: She is alert and oriented to person, place, and time.  Psychiatric:        Behavior: Behavior normal.      Musculoskeletal Exam: C-spine and thoracic spine were in good range of motion.  She has discomfort range of motion of her lumbar spine.   Shoulder joints, elbow joints, wrist joints, MCPs PIPs and DIPs with good range of motion with no synovitis.  She has discomfort range of motion of bilateral hip joints.  Knee joints, ankle joints, MTPs and PIPs with good range of motion.  She had tenderness in her left midfoot due to recent fracture.  She has some osteoarthritic changes in her bilateral feet.  She has tenderness over bilateral trochanteric area and also gluteal region.  She had tenderness over bilateral lateral epicondyle.  There was no evidence of Achilles tendinitis or plantar fasciitis.  CDAI Exam: CDAI Score: -- Patient Global: --; Provider Global: -- Swollen: --; Tender: -- Joint Exam 05/30/2019   No joint exam has been documented for this visit   There is currently no information documented on the homunculus. Go to the Rheumatology activity and complete the homunculus joint exam.  Investigation: No additional findings.  Imaging: No results found.  Recent Labs: Lab Results  Component Value Date   WBC 4.6 05/04/2019   HGB 14.5 05/04/2019   PLT 306 05/04/2019   NA 140 05/04/2019   K 3.9 05/04/2019   CL 102 05/04/2019   CO2 25 05/04/2019   GLUCOSE 90 05/04/2019   BUN 16 05/04/2019   CREATININE 0.79 05/04/2019   BILITOT 0.3 05/04/2019   ALKPHOS 85 05/04/2019   AST 21 05/04/2019   ALT 26 05/04/2019   PROT 7.3 05/04/2019   ALBUMIN 4.7 05/04/2019   CALCIUM 9.8 05/04/2019   GFRAA 99 05/04/2019    Speciality Comments: No specialty comments available.  Procedures:  No procedures performed Allergies: Amoxicillin-pot clavulanate   Assessment / Plan:     Visit Diagnoses: Polyarthralgia -records are reviewed from Dr. Ammie Ferrier office.  05/04/19: RF<10, ESR 14, ANA-, TSH 3.23, T4 1.05, Vitamin D 29.6  -patient continues to have pain and discomfort in the multiple joints.  No warmth swelling or effusion was noted.  No synovitis was noted.  To complete the work-up I will obtain additional labs today.  Plan: Cyclic  citrul peptide antibody, IgG, 14-3-3 eta Protein, Uric acid, HLA-B27 antigen  Myalgia -she complains of increased muscle pain.  She may have a component of myofascial pain.  Plan: CK  Chronic midline low back pain without sciatica -patient gives history of degenerative disc disease in the past.  Plan: XR Lumbar Spine 2-3 Views.  Multilevel spondylosis and facet joint arthropathy was noted.  Levoscoliosis was noted.  Patient has an appointment coming up with neurology as well.  Chronic pain of both hips -she had painful range of motion of bilateral hip joints.  Plan: XR HIPS BILAT W OR W/O PELVIS 3-4 VIEWS.  X-ray of bilateral hip joints were within normal limits.  No SI joint sclerosis or narrowing was noted.  Pain in left foot-patient has difficulty walking and is in a boot.  She states she was recently diagnosed with a foot fracture by Dr. Gershon Mussel.  Essential hypertension-blood pressure is elevated.  Have advised her to monitor blood pressure closely.  History of hyperlipidemia  Family history of psoriasis - son.  According to patient he most likely has psoriatic arthritis as well.  His symptoms improved after taking Stelara.  Orders: Orders Placed This Encounter  Procedures  . XR Lumbar Spine 2-3 Views  . XR HIPS BILAT W OR W/O PELVIS 3-4 VIEWS  . CK  . Cyclic citrul peptide antibody, IgG  . 14-3-3 eta Protein  . Uric acid  . HLA-B27 antigen   No orders of the defined types were placed in this encounter.   Face-to-face time spent with patient was 50 minutes. Greater than 50% of time was spent in counseling and coordination of care.  Follow-Up Instructions: Return for Polyarthralgia.Bo Merino, MD  Note - This record has been created using Editor, commissioning.  Chart creation errors have been sought, but may not always  have been located. Such creation errors do not reflect on  the standard of medical care.

## 2019-05-24 ENCOUNTER — Encounter: Payer: Self-pay | Admitting: Obstetrics & Gynecology

## 2019-05-24 ENCOUNTER — Other Ambulatory Visit: Payer: Self-pay | Admitting: Obstetrics & Gynecology

## 2019-05-24 ENCOUNTER — Encounter (INDEPENDENT_AMBULATORY_CARE_PROVIDER_SITE_OTHER): Payer: Self-pay

## 2019-05-24 DIAGNOSIS — M2550 Pain in unspecified joint: Secondary | ICD-10-CM

## 2019-05-30 ENCOUNTER — Encounter: Payer: Self-pay | Admitting: Rheumatology

## 2019-05-30 ENCOUNTER — Ambulatory Visit: Payer: Self-pay

## 2019-05-30 ENCOUNTER — Ambulatory Visit: Payer: 59 | Admitting: Rheumatology

## 2019-05-30 ENCOUNTER — Other Ambulatory Visit: Payer: Self-pay

## 2019-05-30 ENCOUNTER — Ambulatory Visit (INDEPENDENT_AMBULATORY_CARE_PROVIDER_SITE_OTHER): Payer: 59 | Admitting: Family Medicine

## 2019-05-30 VITALS — BP 158/101 | HR 75 | Resp 15 | Ht 65.5 in | Wt 231.0 lb

## 2019-05-30 DIAGNOSIS — M25552 Pain in left hip: Secondary | ICD-10-CM | POA: Diagnosis not present

## 2019-05-30 DIAGNOSIS — M791 Myalgia, unspecified site: Secondary | ICD-10-CM | POA: Diagnosis not present

## 2019-05-30 DIAGNOSIS — G8929 Other chronic pain: Secondary | ICD-10-CM | POA: Diagnosis not present

## 2019-05-30 DIAGNOSIS — M79672 Pain in left foot: Secondary | ICD-10-CM

## 2019-05-30 DIAGNOSIS — M545 Low back pain: Secondary | ICD-10-CM | POA: Diagnosis not present

## 2019-05-30 DIAGNOSIS — Z8639 Personal history of other endocrine, nutritional and metabolic disease: Secondary | ICD-10-CM

## 2019-05-30 DIAGNOSIS — I10 Essential (primary) hypertension: Secondary | ICD-10-CM

## 2019-05-30 DIAGNOSIS — M25551 Pain in right hip: Secondary | ICD-10-CM

## 2019-05-30 DIAGNOSIS — M2550 Pain in unspecified joint: Secondary | ICD-10-CM | POA: Diagnosis not present

## 2019-05-30 DIAGNOSIS — Z840 Family history of diseases of the skin and subcutaneous tissue: Secondary | ICD-10-CM

## 2019-05-31 NOTE — Progress Notes (Signed)
All the labs are within normal limits.  ANA is pending.

## 2019-06-01 ENCOUNTER — Ambulatory Visit (INDEPENDENT_AMBULATORY_CARE_PROVIDER_SITE_OTHER): Payer: 59 | Admitting: Family Medicine

## 2019-06-01 ENCOUNTER — Other Ambulatory Visit: Payer: Self-pay

## 2019-06-01 ENCOUNTER — Encounter (INDEPENDENT_AMBULATORY_CARE_PROVIDER_SITE_OTHER): Payer: Self-pay | Admitting: Family Medicine

## 2019-06-01 VITALS — BP 124/80 | HR 81 | Temp 98.0°F | Ht 66.0 in | Wt 230.0 lb

## 2019-06-01 DIAGNOSIS — E559 Vitamin D deficiency, unspecified: Secondary | ICD-10-CM | POA: Diagnosis not present

## 2019-06-01 DIAGNOSIS — R0602 Shortness of breath: Secondary | ICD-10-CM | POA: Diagnosis not present

## 2019-06-01 DIAGNOSIS — Z1331 Encounter for screening for depression: Secondary | ICD-10-CM

## 2019-06-01 DIAGNOSIS — Z9189 Other specified personal risk factors, not elsewhere classified: Secondary | ICD-10-CM

## 2019-06-01 DIAGNOSIS — Z6837 Body mass index (BMI) 37.0-37.9, adult: Secondary | ICD-10-CM

## 2019-06-01 DIAGNOSIS — Z8639 Personal history of other endocrine, nutritional and metabolic disease: Secondary | ICD-10-CM

## 2019-06-01 DIAGNOSIS — Z0289 Encounter for other administrative examinations: Secondary | ICD-10-CM

## 2019-06-01 DIAGNOSIS — F419 Anxiety disorder, unspecified: Secondary | ICD-10-CM

## 2019-06-01 DIAGNOSIS — R5383 Other fatigue: Secondary | ICD-10-CM

## 2019-06-01 NOTE — Progress Notes (Signed)
Chief Complaint:   OBESITY Lori Luna (MR# [id number]) is a 54 y.o. female who presents for evaluation and treatment of obesity and related comorbidities. Current BMI is Body mass index is 37.12 kg/m. [name] has been struggling with her weight for many years and has been unsuccessful in either losing weight, maintaining weight loss, or reaching her healthy weight goal.  Lyrik is currently in the action stage of change and ready to dedicate time achieving and maintaining a healthier weight. [name] is interested in becoming our patient and working on intensive lifestyle modifications including (but not limited to) diet and exercise for weight loss.  [name]'s habits were reviewed today and are as follows: Her family eats meals together, she thinks her family will eat healthier with her, her desired weight loss is 50 lbs, she started gaining weight during menopause period (2 years), her heaviest weight ever was 230 pounds, she has significant food cravings issues, she snacks frequently in the evenings, she wakes up frequently in the middle of the night to eat, she skips meals frequently, she is frequently drinking liquids with calories, she frequently makes poor food choices, she has problems with excessive hunger and she struggles with emotional eating.  Depression Screen Rendi's Food and Mood (modified PHQ-9) score was 11.  Depression screen PHQ 2/9 06/01/2019  Decreased Interest 2  Down, Depressed, Hopeless 1  PHQ - 2 Score 3  Altered sleeping 0  Tired, decreased energy 3  Change in appetite 3  Feeling bad or failure about yourself  1  Trouble concentrating 0  Moving slowly or fidgety/restless 1  Suicidal thoughts 0  PHQ-9 Score 11  Difficult doing work/chores Somewhat difficult   Subjective:   1. Other fatigue Lori Luna admits to daytime somnolence and admits to waking up still tired. Patent has a history of symptoms of daytime fatigue. Lori Luna generally gets 5 or  6 hours of sleep per night, and states that she has difficulty falling back asleep if awakened. Snoring is present. Apneic episodes are not present. Epworth Sleepiness Score is 5.  2. Shortness of breath on exertion Lori Luna notes increasing shortness of breath with exercising and seems to be worsening over time with weight gain. She notes getting out of breath sooner with activity than she used to. This has not gotten worse recently. [name] denies shortness of breath at rest or orthopnea.  3. Vitamin D deficiency Lori Luna is low in her Vit D level.  4. History of low potassium Lori Luna had low potassium in the past with no formal diagnosis. She notes fatigue and muscle aches.  5. Anxiety Lori Luna is on Lexapro and she has a lot of pain issues. She notes some emotional eating issues.  6. At risk for osteoporosis Lori Luna is at higher risk of osteopenia and osteoporosis due to Vitamin D deficiency.   Assessment/Plan:   1. Other fatigue Lori Luna does feel that her weight is causing her energy to be lower than it should be. Fatigue may be related to obesity, depression or many other causes. Labs will be ordered, and in the meanwhile, [name] will focus on self care including making healthy food choices, increasing physical activity and focusing on stress reduction.  - EKG 12-Lead - Comprehensive metabolic panel - Insulin, random - Folate - Vitamin B12  2. Shortness of breath on exertion Lori Luna does feel that she gets out of breath more easily that she used to when she exercises. Lori Luna's shortness of breath appears to be obesity related and  exercise induced. She has agreed to work on weight loss and gradually increase exercise to treat her exercise induced shortness of breath. Will continue to monitor closely.  3. Vitamin D deficiency Low Vitamin D level contributes to fatigue and are associated with obesity, breast, and colon cancer. Lori Luna agreed to continue taking VIT D3 2,000 IU daily.  She will follow-up for routine testing of Vitamin D, at least 2-3 times per year to avoid over-replacement. We will recheck labs in 3 months.  4. History of low potassium We will check potassium and magnesium today, and will follow up.  - Magnesium - Potassium  5. Anxiety Behavior modification techniques were discussed today to help [name] deal with her anxiety. We will refer to Dr. Mallie Mussel, our Bariatric Psychologist for evaluation. Orders and follow up as documented in patient record.   6. Depression screening Lori Luna had a positive depression screening. Depression is commonly associated with obesity and often results in emotional eating behaviors. We will monitor this closely and work on CBT to help improve the non-hunger eating patterns. Referral to Psychology may be required if no improvement is seen as she continues in our clinic.  7. At risk for osteoporosis Lori Luna was given approximately 15 minutes of osteoporosis prevention counseling today. Lori Luna is at risk for osteopenia and osteoporosis due to her Vitamin D deficiency. She was encouraged to take her Vitamin D and follow her higher calcium diet and increase strengthening exercise to help strengthen her bones and decrease her risk of osteopenia and osteoporosis.  Repetitive spaced learning was employed today to elicit superior memory formation and behavioral change.  8. Class 2 severe obesity with serious comorbidity and body mass index (BMI) of 37.0 to 37.9 in adult, unspecified obesity type (HCC) Lori Luna is currently in the action stage of change and her goal is to continue with weight loss efforts. I recommend Lori Luna begin the structured treatment plan as follows:  She has agreed to the Category 3 Plan.  Exercise goals: No exercise has been prescribed at this time.   Behavioral modification strategies: increasing lean protein intake and meal planning and cooking strategies.  She was informed of the importance of frequent  follow-up visits to maximize her success with intensive lifestyle modifications for her multiple health conditions. She was informed we would discuss her lab results at her next visit unless there is a critical issue that needs to be addressed sooner. Matrice agreed to keep her next visit at the agreed upon time to discuss these results.  Objective:   Blood pressure 124/80, pulse 81, temperature 98 F (36.7 C), temperature source Oral, height 5\' 6"  (1.676 m), weight 230 lb (104.3 kg), last menstrual period 10/19/2018, SpO2 96 %. Body mass index is 37.12 kg/m.  EKG: Normal sinus rhythm, rate 80 BPM.  Indirect Calorimeter completed today shows a VO2 of 235 and a REE of 1638.  Her calculated basal metabolic rate is [id number] thus her basal metabolic rate is worse than expected.  General: Cooperative, alert, well developed, in no acute distress. HEENT: Conjunctivae and lids unremarkable. Cardiovascular: Regular rhythm.  Lungs: Normal work of breathing. Neurologic: No focal deficits.   Lab Results  Component Value Date   CREATININE 0.79 05/04/2019   BUN 16 05/04/2019   NA 140 05/04/2019   K 3.9 05/04/2019   CL 102 05/04/2019   CO2 25 05/04/2019   Lab Results  Component Value Date   ALT 26 05/04/2019   AST 21 05/04/2019   ALKPHOS 85 05/04/2019  BILITOT 0.3 05/04/2019   Lab Results  Component Value Date   HGBA1C 5.5 05/04/2019   No results found for: INSULIN Lab Results  Component Value Date   TSH 3.230 05/04/2019   No results found for: CHOL, HDL, LDLCALC, LDLDIRECT, TRIG, CHOLHDL Lab Results  Component Value Date   WBC 4.6 05/04/2019   HGB 14.5 05/04/2019   HCT 43.5 05/04/2019   MCV 87 05/04/2019   PLT 306 05/04/2019   No results found for: IRON, TIBC, FERRITIN  Attestation Statements:   Reviewed by clinician on day of visit: allergies, medications, problem list, medical history, surgical history, family history, social history, and previous encounter notes.   I,  Trixie Dredge, am acting as transcriptionist for Dennard Nip, MD.  I have reviewed the above documentation for accuracy and completeness, and I agree with the above. - Dennard Nip, MD

## 2019-06-02 ENCOUNTER — Other Ambulatory Visit: Payer: Self-pay | Admitting: Obstetrics & Gynecology

## 2019-06-02 DIAGNOSIS — Z1231 Encounter for screening mammogram for malignant neoplasm of breast: Secondary | ICD-10-CM

## 2019-06-02 LAB — COMPREHENSIVE METABOLIC PANEL
ALT: 34 IU/L — ABNORMAL HIGH (ref 0–32)
AST: 19 IU/L (ref 0–40)
Albumin/Globulin Ratio: 1.8 (ref 1.2–2.2)
Albumin: 4.7 g/dL (ref 3.8–4.9)
Alkaline Phosphatase: 90 IU/L (ref 39–117)
BUN/Creatinine Ratio: 16 (ref 9–23)
BUN: 14 mg/dL (ref 6–24)
Bilirubin Total: 0.3 mg/dL (ref 0.0–1.2)
CO2: 24 mmol/L (ref 20–29)
Calcium: 10.3 mg/dL — ABNORMAL HIGH (ref 8.7–10.2)
Chloride: 103 mmol/L (ref 96–106)
Creatinine, Ser: 0.86 mg/dL (ref 0.57–1.00)
GFR calc Af Amer: 89 mL/min/{1.73_m2} (ref >59)
GFR calc non Af Amer: 77 mL/min/{1.73_m2} (ref >59)
Globulin, Total: 2.6 g/dL (ref 1.5–4.5)
Glucose: 86 mg/dL (ref 65–99)
Potassium: 4.4 mmol/L (ref 3.5–5.2)
Sodium: 141 mmol/L (ref 134–144)
Total Protein: 7.3 g/dL (ref 6.0–8.5)

## 2019-06-02 LAB — MAGNESIUM: Magnesium: 2.1 mg/dL (ref 1.6–2.3)

## 2019-06-02 LAB — FOLATE: Folate: 13.5 ng/mL (ref >3.0)

## 2019-06-02 LAB — INSULIN, RANDOM: INSULIN: 15 u[IU]/mL (ref 2.6–24.9)

## 2019-06-02 LAB — VITAMIN B12: Vitamin B-12: 374 pg/mL (ref 232–1245)

## 2019-06-03 LAB — CK: Total CK: 110 U/L (ref 29–143)

## 2019-06-03 LAB — HLA-B27 ANTIGEN: HLA-B27 Antigen: NEGATIVE

## 2019-06-03 LAB — CYCLIC CITRUL PEPTIDE ANTIBODY, IGG: Cyclic Citrullin Peptide Ab: <16 UNITS

## 2019-06-03 LAB — URIC ACID: Uric Acid, Serum: 6 mg/dL (ref 2.5–7.0)

## 2019-06-03 LAB — 14-3-3 ETA PROTEIN: 14-3-3 eta Protein: <0.2 ng/mL (ref <0.2)

## 2019-06-08 ENCOUNTER — Telehealth: Payer: Self-pay

## 2019-06-08 NOTE — Telephone Encounter (Signed)
Spoke with patient in response to an advice request. Patient woke up this morning with left shoulder pain. She thinks she may have slept on her arm wrong but the pain is in her neck and left clavicle as well. No chest pain, dizziness, lightheadedness, SOB or sweating. Patient feels fatigued today, she is staying with her mother due being in the middle of moving. Patient has taken 800 mg of ibuprofen without relief as well as a baby aspirin. Patient just writing in for advice to ensure this is not heart related. Mychart encounter routed to primary nurse and MD for review.

## 2019-06-09 NOTE — Telephone Encounter (Signed)
Spoke with patient and she stated her shoulder was a little better She applied heat last night Woke up yesterday with throbbing left sided shoulder pain.  Patient stated today she feels the pain when presses area and thinks musculoskeletal. Was lifting boxes night before Monitored blood pressure, remains good.  Denies shortness of breath, dizziness, nausea, or diaphoresis Patient will continue to monitor and go to ED if develops any of the above s/s

## 2019-06-12 NOTE — Telephone Encounter (Signed)
That does not sound cardiac.  I would be more concerned about exertional symptoms.

## 2019-06-15 ENCOUNTER — Ambulatory Visit (INDEPENDENT_AMBULATORY_CARE_PROVIDER_SITE_OTHER): Payer: 59 | Admitting: Family Medicine

## 2019-06-15 ENCOUNTER — Encounter (INDEPENDENT_AMBULATORY_CARE_PROVIDER_SITE_OTHER): Payer: Self-pay | Admitting: Family Medicine

## 2019-06-15 ENCOUNTER — Other Ambulatory Visit: Payer: Self-pay

## 2019-06-15 VITALS — BP 146/79 | HR 73 | Temp 98.0°F | Ht 66.0 in | Wt 231.0 lb

## 2019-06-15 DIAGNOSIS — Z6837 Body mass index (BMI) 37.0-37.9, adult: Secondary | ICD-10-CM

## 2019-06-15 DIAGNOSIS — I10 Essential (primary) hypertension: Secondary | ICD-10-CM | POA: Diagnosis not present

## 2019-06-15 DIAGNOSIS — E8881 Metabolic syndrome: Secondary | ICD-10-CM | POA: Diagnosis not present

## 2019-06-15 NOTE — Progress Notes (Signed)
Chief Complaint:   OBESITY Lori Luna is here to discuss her progress with her obesity treatment plan along with follow-up of her obesity related diagnoses. Lori Luna is on the Category 3 Plan and states she is following her eating plan approximately 80% of the time. Lori Luna states she is doing 0 minutes 0 times per week.  Today's visit was #: 2 Starting weight: 230 lbs Starting date: 06/01/2019 Today's weight: 231 lbs Today's date: 06/15/2019 Total lbs lost to date: 0 Total lbs lost since last in-office visit: 0  Interim History: Lori Luna did well with her plan for about 10 days, and then went on vacation and did some celebration eating although she tried to be mindful. She is retaining some water today, and it appears she is actually down 2 lbs in fat since our last visit.  Subjective:   1. Insulin resistance Lori Luna has a new diagnosis of insulin resistance. Her fasting insulin is elevated with normal glucose and A1c. She notes polyphagia. I discussed labs with the patient today.  2. Essential hypertension Lori Luna's blood pressure is worsening and continues to be elevated even on medications. She is still having a lot of pain which may be contributing.  Assessment/Plan:   1. Insulin resistance Lori Luna will continue to work on weight loss, exercise, and decreasing simple carbohydrates to help decrease the risk of diabetes. Lori Luna agreed to follow-up with [name] as directed to closely monitor her progress.  2. Essential hypertension Lori Luna will continue with healthy weight loss, diet and exercise to improve blood pressure control. We will watch for signs of hypotension as she continues her lifestyle modifications. She is to discuss with her primary care physician.  3. Class 2 severe obesity with serious comorbidity and body mass index (BMI) of 37.0 to 37.9 in adult, unspecified obesity type (HCC) Lori Luna is currently in the action stage of change. As such, her goal is to continue with  weight loss efforts. She has agreed to change to keeping a food journal and adhering to recommended goals of 1400-1600 calories and 90+ grams of protein daily.   Behavioral modification strategies: increasing lean protein intake and keeping a strict food journal.  Lori Luna has agreed to follow-up with our clinic in 2 weeks. She was informed of the importance of frequent follow-up visits to maximize her success with intensive lifestyle modifications for her multiple health conditions.   Objective:   Blood pressure (!) 146/79, pulse 73, temperature 98 F (36.7 C), temperature source Oral, height 5\' 6"  (1.676 m), weight 231 lb (104.8 kg), last menstrual period 10/19/2018, SpO2 97 %. Body mass index is 37.28 kg/m.  General: Cooperative, alert, well developed, in no acute distress. HEENT: Conjunctivae and lids unremarkable. Cardiovascular: Regular rhythm.  Lungs: Normal work of breathing. Neurologic: No focal deficits.   Lab Results  Component Value Date   CREATININE 0.86 06/01/2019   BUN 14 06/01/2019   NA 141 06/01/2019   K 4.4 06/01/2019   CL 103 06/01/2019   CO2 24 06/01/2019   Lab Results  Component Value Date   ALT 34 (H) 06/01/2019   AST 19 06/01/2019   ALKPHOS 90 06/01/2019   BILITOT 0.3 06/01/2019   Lab Results  Component Value Date   HGBA1C 5.5 05/04/2019   Lab Results  Component Value Date   INSULIN 15.0 06/01/2019   Lab Results  Component Value Date   TSH 3.230 05/04/2019   No results found for: CHOL, HDL, LDLCALC, LDLDIRECT, TRIG, CHOLHDL Lab Results  Component Value  Date   WBC 4.6 05/04/2019   HGB 14.5 05/04/2019   HCT 43.5 05/04/2019   MCV 87 05/04/2019   PLT 306 05/04/2019   No results found for: IRON, TIBC, FERRITIN  Attestation Statements:   Reviewed by clinician on day of visit: allergies, medications, problem list, medical history, surgical history, family history, social history, and previous encounter notes.  Time spent on visit including  pre-visit chart review and post-visit care and charting was 40 minutes.    I, Trixie Dredge, am acting as transcriptionist for Dennard Nip, MD.  I have reviewed the above documentation for accuracy and completeness, and I agree with the above. -  Dennard Nip, MD

## 2019-06-19 ENCOUNTER — Encounter (INDEPENDENT_AMBULATORY_CARE_PROVIDER_SITE_OTHER): Payer: Self-pay | Admitting: Family Medicine

## 2019-06-19 ENCOUNTER — Encounter: Payer: Self-pay | Admitting: Cardiovascular Disease

## 2019-06-19 ENCOUNTER — Other Ambulatory Visit: Payer: Self-pay

## 2019-06-19 ENCOUNTER — Ambulatory Visit: Payer: 59 | Admitting: Cardiovascular Disease

## 2019-06-19 VITALS — BP 118/82 | HR 81 | Ht 65.0 in | Wt 226.0 lb

## 2019-06-19 DIAGNOSIS — I10 Essential (primary) hypertension: Secondary | ICD-10-CM | POA: Diagnosis not present

## 2019-06-19 DIAGNOSIS — E7800 Pure hypercholesterolemia, unspecified: Secondary | ICD-10-CM | POA: Diagnosis not present

## 2019-06-19 DIAGNOSIS — R0789 Other chest pain: Secondary | ICD-10-CM

## 2019-06-19 NOTE — Telephone Encounter (Signed)
Please advise 

## 2019-06-19 NOTE — Patient Instructions (Signed)
Medication Instructions:  Your physician recommends that you continue on your current medications as directed. Please refer to the Current Medication list given to you today.  *If you need a refill on your cardiac medications before your next appointment, please call your pharmacy*  Lab Work: NONE   Testing/Procedures: NONE   Follow-Up: At [facility], you and your health needs are our priority.  As part of our continuing mission to provide you with exceptional heart care, we have created designated Provider Care Teams.  These Care Teams include your primary Cardiologist (physician) and Advanced Practice Providers (APPs -  Physician Assistants and Nurse Practitioners) who all work together to provide you with the care you need, when you need it.  We recommend signing up for the patient portal called "MyChart".  Sign up information is provided on this After Visit Summary.  MyChart is used to connect with patients for Virtual Visits (Telemedicine).  Patients are able to view lab/test results, encounter notes, upcoming appointments, etc.  Non-urgent messages can be sent to your provider as well.   To learn more about what you can do with MyChart, go to [email or web address].    Your next appointment:   6 month(s) You will receive a reminder letter in the mail two months in advance. If you don't receive a letter, please call our office to schedule the follow-up appointment.  The format for your next appointment:   Either In Person or Virtual  Provider:   You may see Skeet Latch, MD or one of the following Advanced Practice Providers on your designated Care Team:    Kerin Ransom, PA-C  [place], [place]  [name], [place]  Your physician recommends that you schedule a follow-up appointment in: [place]  Other Instructions   MONITOR AND LOG YOUR BLOOD PRESSURE TWICE A DAY. BRING READINGS AND BLOOD PRESSURE TO 1 MONTH FOLLOW UP WITH PHARM D

## 2019-06-19 NOTE — Progress Notes (Signed)
Cardiology Office Note   Date:  06/19/2019   ID:  Lori Luna, DOB 1965-05-10, MRN [id number]  PCP:  Cari Caraway, MD  Cardiologist:   Skeet Latch, MD   No chief complaint on file.   History of Present Illness: Lori Luna is a 54 y.o. female with hypertension and hyperlipidemia who presents for follow-up.  Lori Luna was initially seen 06/2016 for cardiovascular risk assessment.  She has a family history of CAD and had some episodes of atypical chest pain.  She was referred for coronary CT-a 07/2016 that revealed a coronary calcium score of 0.    Her blood pressure was elevated and enalapril was switched to to olmesartan 20 mg.  Prior to that change she had a cough that has since improved.    She was hospitalized for withdrawal from gabapentin.  She started walking 3 to 5 miles several days per week.  However she was frustrated that she was not losing weight despite her increased exercise.  She was referred to the healthy weight and wellness clinic and has found this helpful.  Several times at clinic her blood pressure was elevated to the 0000000 or [id number] systolic.  This morning when she checked her blood pressure had to come to our office it was low.  She does not frequently tachycardia during the day.  Her last appointment her blood pressure was poorly controlled.  Telmisartan was increased.  This was subsequently switched to telmisartan/HCTZ.  In February she had an episode of waking up with left chest and arm pain.  She described it as a burning pain that did not change with exertion, though she has been on it while unable to exert herself much lately due to an injury to her foot.  There is no associated shortness of breath and the pain was better with palpation of the chest and arm.  Past Medical History:  Diagnosis Date  . Anxiety   . Atypical chest pain 06/19/2016  . Back pain   . Brain fog   . DDD (degenerative disc disease), lumbar   . Dyspnea   . Essential  hypertension 06/19/2016  . Family history of degenerative disc disease   . Gallbladder problem   . Hot flashes   . Hyperlipidemia   . Hyperlipidemia 06/19/2016  . Hypertension   . Joint pain   . Lower extremity edema   . Menopause   . Trouble in sleeping   . Vitamin D deficiency   . Weight gain     Past Surgical History:  Procedure Laterality Date  . GALLBLADDER SURGERY       Current Outpatient Medications  Medication Sig Dispense Refill  . Black Cohosh-SoyIsoflav-C Quad (ESTROVEN MENOPAUSE & WEIGHT) 40-56-300 MG CAPS Take 1 tablet by mouth daily.    . Cholecalciferol (VITAMIN D) 50 MCG (2000 UT) CAPS Take 1 capsule by mouth daily.    Marland Kitchen escitalopram (LEXAPRO) 10 MG tablet Take 10 mg by mouth daily.     Marland Kitchen ibuprofen (ADVIL) 800 MG tablet Take 800 mg by mouth 3 (three) times daily.    . Magnesium 400 MG TABS Take 1 tablet by mouth daily.    . meloxicam (MOBIC) 7.5 MG tablet Take 7.5 mg by mouth daily.    Marland Kitchen olmesartan-hydrochlorothiazide (BENICAR HCT) 40-12.5 MG tablet Take 1 tablet by mouth daily. 90 tablet 3  . traMADol (ULTRAM) 50 MG tablet Take 50 mg by mouth every 6 (six) hours as needed for pain.  No current facility-administered medications for this visit.    Allergies:   Amoxicillin-pot clavulanate    Social History:  The patient  reports that she has never smoked. She has never used smokeless tobacco. She reports current alcohol use of about 2.0 standard drinks of alcohol per week. She reports that she does not use drugs.   Family History:  The patient's family history includes Anxiety disorder in her mother; Autoimmune disease in her son; CAD in her father and maternal grandmother; Cancer in her father, paternal grandfather, and paternal grandmother; Diabetes in her maternal grandmother; Healthy in her brother, son, and son; Hyperlipidemia in her father; Hypertension in her father and mother; Psoriasis in her father and son; Stroke in her maternal aunt; Vitiligo in her  son.    ROS:  Please see the history of present illness.   Otherwise, review of systems are positive for none.   All other systems are reviewed and negative.    PHYSICAL EXAM: VS:  BP 118/82   Pulse 81   Ht 5\' 5"  (1.651 m)   Wt 226 lb (102.5 kg)   LMP 10/19/2018 (Exact Date)   SpO2 100%   BMI 37.61 kg/m  , BMI Body mass index is 37.61 kg/m. GENERAL:  Well appearing HEENT: Pupils equal round and reactive, fundi not visualized, oral mucosa unremarkable NECK:  No jugular venous distention, waveform within normal limits, carotid upstroke brisk and symmetric, no bruits LUNGS:  Clear to auscultation bilaterally HEART:  RRR.  PMI not displaced or sustained,S1 and S2 within normal limits, no S3, no S4, no clicks, no rubs, no murmurs ABD:  Flat, positive bowel sounds normal in frequency in pitch, no bruits, no rebound, no guarding, no midline pulsatile mass, no hepatomegaly, no splenomegaly EXT:  2 plus pulses throughout, no edema, no cyanosis no clubbing.  L foot Cam walker boot SKIN:  No rashes no nodules NEURO:  Cranial nerves II through XII grossly intact, motor grossly intact throughout PSYCH:  Cognitively intact, oriented to person place and time    EKG:  EKG is not ordered today. The ekg ordered 06/19/16 demonstrates sinus rhythm rate 76 bpm.  08/19/2017: Sinus rhythm.  Rate 77 bpm.  Coronary CT-A 07/29/16: 1. Coronary calcium score of 0. This was 0 percentile for age and sex matched control.  2. Normal coronary origin with right dominance.  3. No evidence of CAD.  Recent Labs: 05/04/2019: Hemoglobin 14.5; Platelets 306; TSH 3.230 06/01/2019: ALT 34; BUN 14; Creatinine, Ser 0.86; Magnesium 2.1; Potassium 4.4; Sodium 141    Lipid Panel No results found for: CHOL, TRIG, HDL, CHOLHDL, VLDL, LDLCALC, LDLDIRECT   06/08/16: Total cholesterol 220, triglycerides 175, HDL 56, LDL 129 Sodium 141, potassium 3.8, BUN 17, creatinine 0.1 AST 16, ALT 24  08/02/2018: Total cholesterol  210, triglycerides 166, HDL 49, LDL 129  Wt Readings from Last 3 Encounters:  06/19/19 226 lb (102.5 kg)  06/15/19 231 lb (104.8 kg)  06/01/19 230 lb (104.3 kg)      ASSESSMENT AND PLAN:  # Chest pain: Lori Luna had an episode of atypical chest pain.  It seems to be more consistent with cervical radiculopathy than ischemia.  She is planning to see a neurologist.  She had a coronary calcium score of 0 on 07/2016.    # Hypertension: Blood pressure has been elevated at some appointments but was quite well-controlled today.  She is going to track her blood pressure at home twice daily and continue on losartan/HCTZ for  now.  Follow-up with our pharmacist in 1 month.  # Hyperlipidemia: ASCVD 10 year risk is 3.2%.  Diet and exercise as above.  # Obestiy: Continue high protein diet through [facility]   Current medicines are reviewed at length with the patient today.  The patient does not have concerns regarding medicines.  The following changes have been made: Increase olmesartan to 40 mg.  Labs/ tests ordered today include:   No orders of the defined types were placed in this encounter.    Disposition:   FU with Lori Luna C. Oval Linsey, MD, [facility] in 6 months.  Pharm.D. in 1 month.    Signed, Ashari Llewellyn C. Oval Linsey, MD, [facility]  06/19/2019 9:54 AM    [place]

## 2019-06-21 NOTE — Telephone Encounter (Signed)
Please send in metformin 500 mg qday #30 no RF

## 2019-06-22 ENCOUNTER — Other Ambulatory Visit (INDEPENDENT_AMBULATORY_CARE_PROVIDER_SITE_OTHER): Payer: Self-pay

## 2019-06-22 DIAGNOSIS — E8881 Metabolic syndrome: Secondary | ICD-10-CM

## 2019-06-22 MED ORDER — METFORMIN HCL 500 MG PO TABS: 500.0000 mg | ORAL_TABLET | Freq: Every day | ORAL | 0 refills | Status: DC

## 2019-06-22 NOTE — Telephone Encounter (Signed)
This has been sent in and pt notified

## 2019-06-29 ENCOUNTER — Other Ambulatory Visit: Payer: Self-pay

## 2019-06-29 ENCOUNTER — Encounter (INDEPENDENT_AMBULATORY_CARE_PROVIDER_SITE_OTHER): Payer: Self-pay | Admitting: Family Medicine

## 2019-06-29 ENCOUNTER — Ambulatory Visit (INDEPENDENT_AMBULATORY_CARE_PROVIDER_SITE_OTHER): Payer: 59 | Admitting: Family Medicine

## 2019-06-29 VITALS — BP 114/69 | HR 73 | Temp 97.9°F | Ht 65.0 in | Wt 224.0 lb

## 2019-06-29 DIAGNOSIS — K5909 Other constipation: Secondary | ICD-10-CM | POA: Diagnosis not present

## 2019-06-29 DIAGNOSIS — E8881 Metabolic syndrome: Secondary | ICD-10-CM | POA: Diagnosis not present

## 2019-06-29 DIAGNOSIS — Z9189 Other specified personal risk factors, not elsewhere classified: Secondary | ICD-10-CM | POA: Diagnosis not present

## 2019-06-29 DIAGNOSIS — Z6837 Body mass index (BMI) 37.0-37.9, adult: Secondary | ICD-10-CM

## 2019-06-29 NOTE — Progress Notes (Signed)
Chief Complaint:   OBESITY Lori Luna is here to discuss her progress with her obesity treatment plan along with follow-up of her obesity related diagnoses. Lori Luna is on keeping a food journal and adhering to recommended goals of 1400-1600 calories and 90+ grams of protein daily and states she is following her eating plan approximately 85% of the time. Lori Luna states she is doing 0 minutes 0 times per week.  Today's visit was #: 3 Starting weight: 230 lbs Starting date: 06/01/2019 Today's weight: 224 lbs Today's date: 06/29/2019 Total lbs lost to date: 6 Total lbs lost since last in-office visit: 7  Interim History: Lori Luna reports adhering to the plan well. She has been journaling and has several questions.  Subjective:   1. Other constipation Lori Luna feels the plan is causing her to be constipated and feel bloated. She thinks it may be the cheese. She is using Benefiber but it isn't helping much.  2. Insulin resistance Lori Luna denies polyphagia. She is on metformin and denies diarrhea.  3. At risk for diabetes mellitus Lori Luna is at higher than average risk for developing diabetes due to her obesity and insulin resistance.   Assessment/Plan:   1. Other constipation Iman was informed that a decrease in bowel movement frequency is normal while losing weight, but stools should not be hard or painful. Legina is to take miralax daily as needed.   2. Insulin resistance Lori Luna will continue to work on weight loss, exercise, and decreasing simple carbohydrates to help decrease the risk of diabetes. Lori Luna agreed to continue taking metformin, and she agreed to follow-up with [name] as directed to closely monitor her progress.  3. At risk for diabetes mellitus Lori Luna was given approximately 15 minutes of diabetes education and counseling today. We discussed intensive lifestyle modifications today with an emphasis on weight loss as well as increasing exercise and decreasing simple  carbohydrates in her diet.   Repetitive spaced learning was employed today to elicit superior memory formation and behavioral change.  4. Class 2 severe obesity with serious comorbidity and body mass index (BMI) of 37.0 to 37.9 in adult, unspecified obesity type (HCC) Lori Luna is currently in the action stage of change. As such, her goal is to continue with weight loss efforts. She has agreed to the Category 3 Plan or keeping a food journal and adhering to recommended goals of 1400-1600 calories and 90+ grams of protein daily.   Handouts given today: Smart Fruit, Breakfast Options, and Protein Exchanges.  Exercise goals: No exercise has been prescribed at this time.  Behavioral modification strategies: increasing lean protein intake, decreasing simple carbohydrates and keeping a strict food journal.  Lori Luna has agreed to follow-up with our clinic in 2 to 3 weeks. She was informed of the importance of frequent follow-up visits to maximize her success with intensive lifestyle modifications for her multiple health conditions.   Objective:   Blood pressure 114/69, pulse 73, temperature 97.9 F (36.6 C), temperature source Oral, height 5\' 5"  (1.651 m), weight 224 lb (101.6 kg), last menstrual period 10/19/2018, SpO2 98 %. Body mass index is 37.28 kg/m.  General: Cooperative, alert, well developed, in no acute distress. HEENT: Conjunctivae and lids unremarkable. Cardiovascular: Regular rhythm.  Lungs: Normal work of breathing. Neurologic: No focal deficits.   Lab Results  Component Value Date   CREATININE 0.86 06/01/2019   BUN 14 06/01/2019   NA 141 06/01/2019   K 4.4 06/01/2019   CL 103 06/01/2019   CO2 24 06/01/2019  Lab Results  Component Value Date   ALT 34 (H) 06/01/2019   AST 19 06/01/2019   ALKPHOS 90 06/01/2019   BILITOT 0.3 06/01/2019   Lab Results  Component Value Date   HGBA1C 5.5 05/04/2019   Lab Results  Component Value Date   INSULIN 15.0 06/01/2019   Lab  Results  Component Value Date   TSH 3.230 05/04/2019   No results found for: CHOL, HDL, LDLCALC, LDLDIRECT, TRIG, CHOLHDL Lab Results  Component Value Date   WBC 4.6 05/04/2019   HGB 14.5 05/04/2019   HCT 43.5 05/04/2019   MCV 87 05/04/2019   PLT 306 05/04/2019   No results found for: IRON, TIBC, FERRITIN  Attestation Statements:   Reviewed by clinician on day of visit: allergies, medications, problem list, medical history, surgical history, family history, social history, and previous encounter notes.   Wilhemena Durie, am acting as Location manager for [employer], FNP-C.  I have reviewed the above documentation for accuracy and completeness, and I agree with the above. -  Georgianne Fick, FNP

## 2019-06-30 ENCOUNTER — Other Ambulatory Visit: Payer: 59

## 2019-07-03 ENCOUNTER — Encounter (INDEPENDENT_AMBULATORY_CARE_PROVIDER_SITE_OTHER): Payer: Self-pay | Admitting: Family Medicine

## 2019-07-10 ENCOUNTER — Other Ambulatory Visit: Payer: Self-pay

## 2019-07-10 ENCOUNTER — Ambulatory Visit
Admission: RE | Admit: 2019-07-10 | Discharge: 2019-07-10 | Disposition: A | Discharge status: Home / Self Care | Payer: 59 | Source: Ambulatory Visit | Attending: Obstetrics & Gynecology | Admitting: Obstetrics & Gynecology

## 2019-07-10 DIAGNOSIS — Z1231 Encounter for screening mammogram for malignant neoplasm of breast: Secondary | ICD-10-CM

## 2019-07-14 ENCOUNTER — Other Ambulatory Visit (INDEPENDENT_AMBULATORY_CARE_PROVIDER_SITE_OTHER): Payer: Self-pay | Admitting: Family Medicine

## 2019-07-14 DIAGNOSIS — E8881 Metabolic syndrome: Secondary | ICD-10-CM

## 2019-07-14 NOTE — Progress Notes (Signed)
Office Visit Note  Patient: Lori Luna             Date of Birth: 1965-07-12           MRN: [id number]             PCP: Cari Caraway, MD Referring: Cari Caraway, MD Visit Date: 07/18/2019 Occupation: _0 @  Subjective:  Discuss lab work   History of Present Illness: Nykia Turko is a 54 y.o. female with history of polyarthralgia and myofascial pain.   She presents today to discuss lab work.  She continues to have pain in the right knee joint, both feet, and lower back/pelvis.  She denies any joint swelling or warmth.  She states she has difficulty walking if she wakes up in the night due to the discomfort in both feet. She states she recently fractured her left 2nd metatarsal and was evaluated by Dr. Gershon Mussel.  She was placed in a boot and was transitioned to regular tennis shoes 2 weeks ago.  She had a DEXA this morning. She has been taking motrin as needed for pain relief.  She states she has been seeing the chiropractor 3 times a week for the past 3 weeks, which has been improving her lower back and pelvis discomfort.  She is not having any difficulty getting up from a seated position at this time. She performs stretching exercises daily.    Activities of Daily Living:  Patient reports morning stiffness for 1 hour.   Patient Denies nocturnal pain.  Difficulty dressing/grooming: Denies Difficulty climbing stairs: Reports Difficulty getting out of chair: Denies Difficulty using hands for taps, buttons, cutlery, and/or writing: Denies  Review of Systems  Constitutional: Negative for fatigue.  HENT: Negative for mouth sores, mouth dryness and nose dryness.   Eyes: Negative for pain, visual disturbance and dryness.  Respiratory: Negative for cough, hemoptysis, shortness of breath and difficulty breathing.   Cardiovascular: Negative for chest pain, palpitations, hypertension and swelling in legs/feet.  Gastrointestinal: Negative for blood in stool, constipation  and diarrhea.  Endocrine: Negative for excessive thirst and increased urination.  Genitourinary: Negative for difficulty urinating and painful urination.  Musculoskeletal: Positive for arthralgias, joint pain, muscle weakness, morning stiffness and muscle tenderness. Negative for joint swelling, myalgias and myalgias.  Skin: Positive for rash. Negative for color change, pallor, hair loss, nodules/bumps, skin tightness, ulcers and sensitivity to sunlight.  Allergic/Immunologic: Negative for susceptible to infections.  Neurological: Negative for dizziness and headaches.  Hematological: Negative for bruising/bleeding tendency and swollen glands.  Psychiatric/Behavioral: Negative for depressed mood and sleep disturbance. The patient is not nervous/anxious.     PMFS History:  Patient Active Problem List   Diagnosis Date Noted   Class 2 severe obesity with serious comorbidity and body mass index (BMI) of 37.0 to 37.9 in adult [facility]) 07/03/2019   Atypical chest pain 06/19/2016   Essential hypertension 06/19/2016   Hyperlipidemia 06/19/2016    Past Medical History:  Diagnosis Date   Anxiety    Atypical chest pain 06/19/2016   Back pain    Brain fog    DDD (degenerative disc disease), lumbar    Dyspnea    Essential hypertension 06/19/2016   Family history of degenerative disc disease    Gallbladder problem    Hot flashes    Hyperlipidemia    Hyperlipidemia 06/19/2016   Hypertension    Joint pain    Lower extremity edema    Menopause    Trouble in sleeping  Vitamin D deficiency    Weight gain     Family History  Problem Relation Age of Onset   Hypertension Mother    Anxiety disorder Mother    Hypertension Father    CAD Father    Psoriasis Father    Hyperlipidemia Father    Cancer Father    Diabetes Maternal Grandmother    CAD Maternal Grandmother    Cancer Paternal Grandmother    Cancer Paternal Grandfather    Stroke Maternal Aunt    Healthy  Brother    Healthy Son    Healthy Son    Autoimmune disease Son    Psoriasis Son    Vitiligo Son    Breast cancer Neg Hx    Past Surgical History:  Procedure Laterality Date   GALLBLADDER SURGERY     Social History   Social History Narrative   Not on file   Immunization History  Administered Date(s) Administered   Influenza-Unspecified 02/02/2019   Tdap 04/30/2014     Objective: Vital Signs: BP 127/90 (BP Location: Left Arm, Patient Position: Sitting, Cuff Size: Normal)    Pulse 72    Resp 14    Ht _0  (1.676 m)    Wt 226 lb 6.4 oz (102.7 kg)    LMP 10/19/2018 (Exact Date)    BMI 36.54 kg/m    Physical Exam Vitals and nursing note reviewed.  Constitutional:      Appearance: She is well-developed.  HENT:     Head: Normocephalic and atraumatic.  Eyes:     Conjunctiva/sclera: Conjunctivae normal.  Pulmonary:     Effort: Pulmonary effort is normal.  Abdominal:     General: Bowel sounds are normal.     Palpations: Abdomen is soft.  Musculoskeletal:     Cervical back: Normal range of motion.  Lymphadenopathy:     Cervical: No cervical adenopathy.  Skin:    General: Skin is warm and dry.     Capillary Refill: Capillary refill takes less than 2 seconds.  Neurological:     Mental Status: She is alert and oriented to person, place, and time.  Psychiatric:        Behavior: Behavior normal.      Musculoskeletal Exam: C-spine, thoracic spine, and lumbar spine good ROM.  No midline spinal tenderness.  No SI joint tenderness.  Shoulder joints, elbow joints, wrist joints, MCPs ,PIPs, and DIPs good ROM with no synovitis.  Complete fist formation bilaterally. DIP thickening consistent with osteoarthritis of both hands.  Hip joints, knee joints, ankle joints, MTPs, PIPs, and DIPs good ROM with no synovitis.  No warmth or effusion of knee joints.  No tenderness or swelling of ankle joints.  Tenderness over bilateral trochanteric bursa.   CDAI Exam: CDAI Score:  -- Patient Global: --; Provider Global: -- Swollen: --; Tender: -- Joint Exam 07/18/2019   No joint exam has been documented for this visit   There is currently no information documented on the homunculus. Go to the Rheumatology activity and complete the homunculus joint exam.  Investigation: No additional findings.  Imaging: DG Bone Density  Result Date: 07/18/2019 EXAM: DUAL X-RAY ABSORPTIOMETRY (DXA) FOR BONE MINERAL DENSITY IMPRESSION: Referring Physician:  Megan Salon Your patient completed a BMD test using Lunar IDXA DXA system ( analysis version: 16 ) manufactured by [employer]. Technologist: WLS PATIENT: Name: Auset, Fritzler Patient ID: [id number] Birth Date: 07-18-1965 Height: 66.0 in. Sex: Female Measured: 07/18/2019 Weight: 225.2 lbs. Indications: Caucasian, Estrogen Deficient, Family  History of Osteoporosis, History of Fracture (Adult) (V15.51), Lexapro, Postmenopausal Fractures: Foot Treatments: Vitamin D (E933.5) ASSESSMENT: The BMD measured at AP Spine L1-L4 (L2,L3) is 0.962 g/cm2 with a T-score of -1.7. This patient is considered osteopenic according to [facility]) criteria. The scan quality is limited by patient body habitus. L-2, L-3 were excluded due to degenerative changes. Site Region Measured Date Measured Age YA BMD Significant CHANGE T-score AP Spine L1-L4 (L2,L3) 07/18/2019 54.2 -1.7 0.962 g/cm2 DualFemur Neck Left 07/18/2019 54.2 -0.8 0.930 g/cm2 DualFemur Total Mean 07/18/2019 54.2 -0.2 0.988 g/cm2 World Health Organization [facility]) criteria for post-menopausal, Caucasian Women: Normal       T-score at or above -1 SD Osteopenia   T-score between -1 and -2.5 SD Osteoporosis T-score at or below -2.5 SD RECOMMENDATION: 1. All patients should optimize calcium and vitamin D intake. 2. Consider FDA approved medical therapies in postmenopausal women and men aged 53 years and older, based on the following: a. A hip or vertebral (clinical or morphometric)  fracture b. T- score < or = -2.5 at the femoral neck or spine after appropriate evaluation to exclude secondary causes c. Low bone mass (T-score between -1.0 and -2.5 at the femoral neck or spine) and a 10 year probability of a hip fracture > or = 3% or a 10 year probability of a major osteoporosis-related fracture > or = 20% based on the [place]-adapted WHO algorithm d. Clinician judgment and/or patient preferences may indicate treatment for people with 10-year fracture probabilities above or below these levels FOLLOW-UP: Patients with diagnosis of osteoporosis or at high risk for fracture should have regular bone mineral density tests. For patients eligible for Medicare, routine testing is allowed once every 2 years. The testing frequency can be increased to one year for patients who have rapidly progressing disease, those who are receiving or discontinuing medical therapy to restore bone mass, or have additional risk factors. I have reviewed this report and agree with the above findings. [facility] FRAX* 10-year Probability of Fracture Based on femoral neck BMD: DualFemur (Left) Major Osteoporotic Fracture: 8.7% Hip Fracture:                0.3% Population:                  [place] (Caucasian) Risk Factors:                History of Fracture (Adult) (V15.51) *FRAX is a Materials engineer of the [employer] of [employer] for Metabolic Bone Disease, a World Pharmacologist (WHO) [facility]. ASSESSMENT: The probability of a major osteoporotic fracture is 8.7 % within the next ten years. The probability of a hip fracture is 0.3 % within the next ten years. Electronically Signed   By: Lowella Grip III M.D.   On: 07/18/2019 08:42   MM 3D SCREEN BREAST BILATERAL  Result Date: 07/11/2019 CLINICAL DATA:  Screening. EXAM: DIGITAL SCREENING BILATERAL MAMMOGRAM WITH TOMO AND CAD COMPARISON:  Previous exam(s). ACR Breast Density Category c: The breast tissue is heterogeneously dense, which  may obscure small masses. FINDINGS: There are no findings suspicious for malignancy. Images were processed with CAD. IMPRESSION: No mammographic evidence of malignancy. A result letter of this screening mammogram will be mailed directly to the patient. RECOMMENDATION: Screening mammogram in one year. (Code:[id number]-B-01Y) BI-RADS CATEGORY  1: Negative. Electronically Signed   By: Everlean Alstrom M.D.   On: 07/11/2019 09:05    Recent Labs: Lab Results  Component  Value Date   WBC 4.6 05/04/2019   HGB 14.5 05/04/2019   PLT 306 05/04/2019   NA 141 06/01/2019   K 4.4 06/01/2019   CL 103 06/01/2019   CO2 24 06/01/2019   GLUCOSE 86 06/01/2019   BUN 14 06/01/2019   CREATININE 0.86 06/01/2019   BILITOT 0.3 06/01/2019   ALKPHOS 90 06/01/2019   AST 19 06/01/2019   ALT 34 (H) 06/01/2019   PROT 7.3 06/01/2019   ALBUMIN 4.7 06/01/2019   CALCIUM 10.3 (H) 06/01/2019   GFRAA 89 06/01/2019   May 30, 2019 CK 110, anti-CCP negative, _0 eta negative, uric acid 6.0, HLA-B27 negative  Speciality Comments: No specialty comments available.  Procedures:  No procedures performed Allergies: Amoxicillin-pot clavulanate           Assessment / Plan:     Visit Diagnoses: Polyarthralgia - All autoimmune work-up was negative.  Lab results were discussed and all questions were addressed.  She has no clinical features of autoimmune disease at this time.  She continues to have generalized arthralgias but no clinical features of RA or PsA were noted.  She has no synovitis or dactylitis on exam.  We discussed a list of natural antiinflammatories to try taking.  She plans on continuing to take Motrin as needed for pain relief.  She was advised to notify us if she develops increased joint pain or joint swelling. She will follow up in 6 months.  Myofascial pain: She experiences generalized myalgias and muscle tenderness, especially bilateral lower extremities.    DDD (degenerative disc disease), lumbar - And  facet joint arthropathy.She has chronic lower back pain. No midline spinal tenderness.  Her discomfort has improved since seeing the chiropractor 3 times a week for 3 weeks.  She is having less difficulty rising from a seated position. She was given a handout of back exercises to perform.   Chronic pain of both hips - X-rays were unremarkable. She has good ROM with no discomfort.   Trochanteric bursitis of both hips: She has tenderness over trochanteric bursa bilaterally.  She performs stretching exercises daily.  She was given a handout of exercises to perform.   Pain in both feet: She has been experiencing pain in both feet but no joint swelling.  No synovitis was noted on exam.  She has mild pes cavus bilaterally. 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 We discussed the importance of wearing proper fitting shoes.  We also discussed feet exercises to perform.  She was given a handout of natural antiinflammatories to start taking. She plans on following up with Dr. Gershon Mussel.   Pain in left foot - Patient was diagnosed with a second metatarsal fracture of the left foot by Dr. Gershon Mussel.  She discontinued using a boot 2 weeks ago.  She is able to bear full weight.   Other medical conditions are listed as follows:   Essential hypertension  History of hyperlipidemia  Family history of psoriasis    Orders: No orders of the defined types were placed in this encounter.  No orders of the defined types were placed in this encounter.   Face-to-face time spent with patient was 30 minutes. Greater than 50% of time was spent in counseling and coordination of care.  Follow-Up Instructions: Return in about 6 months (around 01/18/2020) for Myofascial pain, DDD.   Ofilia Neas, PA-C   I examined and evaluated the patient with Hazel Sams PA.  He had detailed discussion  regarding all the lab work with the patient.  All autoimmune work-up was negative.  She continues to have some discomfort due to  underlying osteoarthritis.  She had bilateral trochanteric bursitis.  IT band exercises were demonstrated and a handout was given.  She also had bilateral pes cavus which causes discomfort.  Proper fitting shoes were discussed.  The exercises for bilateral feet were also demonstrated in the office today.  The plan of care was discussed as noted above.  Bo Merino, MD  Note - This record has been created using Editor, commissioning.  Chart creation errors have been sought, but may not always  have been located. Such creation errors do not reflect on  the standard of medical care.

## 2019-07-18 ENCOUNTER — Encounter: Payer: Self-pay | Admitting: Rheumatology

## 2019-07-18 ENCOUNTER — Ambulatory Visit
Admission: RE | Admit: 2019-07-18 | Discharge: 2019-07-18 | Disposition: A | Discharge status: Home / Self Care | Payer: 59 | Source: Ambulatory Visit | Attending: Obstetrics & Gynecology | Admitting: Obstetrics & Gynecology

## 2019-07-18 ENCOUNTER — Other Ambulatory Visit: Payer: Self-pay

## 2019-07-18 ENCOUNTER — Ambulatory Visit: Payer: 59 | Admitting: Rheumatology

## 2019-07-18 VITALS — BP 127/90 | HR 72 | Resp 14 | Ht 66.0 in | Wt 226.4 lb

## 2019-07-18 DIAGNOSIS — M79672 Pain in left foot: Secondary | ICD-10-CM

## 2019-07-18 DIAGNOSIS — M2550 Pain in unspecified joint: Secondary | ICD-10-CM

## 2019-07-18 DIAGNOSIS — Z840 Family history of diseases of the skin and subcutaneous tissue: Secondary | ICD-10-CM

## 2019-07-18 DIAGNOSIS — M25551 Pain in right hip: Secondary | ICD-10-CM

## 2019-07-18 DIAGNOSIS — E2839 Other primary ovarian failure: Secondary | ICD-10-CM

## 2019-07-18 DIAGNOSIS — M7061 Trochanteric bursitis, right hip: Secondary | ICD-10-CM

## 2019-07-18 DIAGNOSIS — Z8639 Personal history of other endocrine, nutritional and metabolic disease: Secondary | ICD-10-CM

## 2019-07-18 DIAGNOSIS — M25552 Pain in left hip: Secondary | ICD-10-CM

## 2019-07-18 DIAGNOSIS — M7918 Myalgia, other site: Secondary | ICD-10-CM

## 2019-07-18 DIAGNOSIS — M7062 Trochanteric bursitis, left hip: Secondary | ICD-10-CM

## 2019-07-18 DIAGNOSIS — M5136 Other intervertebral disc degeneration, lumbar region: Secondary | ICD-10-CM | POA: Diagnosis not present

## 2019-07-18 DIAGNOSIS — M79671 Pain in right foot: Secondary | ICD-10-CM

## 2019-07-18 DIAGNOSIS — G8929 Other chronic pain: Secondary | ICD-10-CM

## 2019-07-18 DIAGNOSIS — I10 Essential (primary) hypertension: Secondary | ICD-10-CM

## 2019-07-18 NOTE — Patient Instructions (Signed)
Back Exercises These exercises help to make your trunk and back strong. They also help to keep the lower back flexible. Doing these exercises can help to prevent back pain or lessen existing pain.  If you have back pain, try to do these exercises 2-3 times each day or as told by your doctor.  As you get better, do the exercises once each day. Repeat the exercises more often as told by your doctor.  To stop back pain from coming back, do the exercises once each day, or as told by your doctor. Exercises Single knee to chest Do these steps 3-5 times in a row for each leg: 1. Lie on your back on a firm bed or the floor with your legs stretched out. 2. Bring one knee to your chest. 3. Grab your knee or thigh with both hands and hold them it in place. 4. Pull on your knee until you feel a gentle stretch in your lower back or buttocks. 5. Keep doing the stretch for 10-30 seconds. 6. Slowly let go of your leg and straighten it. Pelvic tilt Do these steps 5-10 times in a row: 1. Lie on your back on a firm bed or the floor with your legs stretched out. 2. Bend your knees so they point up to the ceiling. Your feet should be flat on the floor. 3. Tighten your lower belly (abdomen) muscles to press your lower back against the floor. This will make your tailbone point up to the ceiling instead of pointing down to your feet or the floor. 4. Stay in this position for 5-10 seconds while you gently tighten your muscles and breathe evenly. Cat-cow Do these steps until your lower back bends more easily: 1. Get on your hands and knees on a firm surface. Keep your hands under your shoulders, and keep your knees under your hips. You may put padding under your knees. 2. Let your head hang down toward your chest. Tighten (contract) the muscles in your belly. Point your tailbone toward the floor so your lower back becomes rounded like the back of a cat. 3. Stay in this position for 5 seconds. 4. Slowly lift your  head. Let the muscles of your belly relax. Point your tailbone up toward the ceiling so your back forms a sagging arch like the back of a cow. 5. Stay in this position for 5 seconds.  Press-ups Do these steps 5-10 times in a row: 1. Lie on your belly (face-down) on the floor. 2. Place your hands near your head, about shoulder-width apart. 3. While you keep your back relaxed and keep your hips on the floor, slowly straighten your arms to raise the top half of your body and lift your shoulders. Do not use your back muscles. You may change where you place your hands in order to make yourself more comfortable. 4. Stay in this position for 5 seconds. 5. Slowly return to lying flat on the floor.  Bridges Do these steps 10 times in a row: 1. Lie on your back on a firm surface. 2. Bend your knees so they point up to the ceiling. Your feet should be flat on the floor. Your arms should be flat at your sides, next to your body. 3. Tighten your butt muscles and lift your butt off the floor until your waist is almost as high as your knees. If you do not feel the muscles working in your butt and the back of your thighs, slide your feet 1-2 inches   farther away from your butt. 4. Stay in this position for 3-5 seconds. 5. Slowly lower your butt to the floor, and let your butt muscles relax. If this exercise is too easy, try doing it with your arms crossed over your chest. Belly crunches Do these steps 5-10 times in a row: 1. Lie on your back on a firm bed or the floor with your legs stretched out. 2. Bend your knees so they point up to the ceiling. Your feet should be flat on the floor. 3. Cross your arms over your chest. 4. Tip your chin a little bit toward your chest but do not bend your neck. 5. Tighten your belly muscles and slowly raise your chest just enough to lift your shoulder blades a tiny bit off of the floor. Avoid raising your body higher than that, because it can put too much stress on your low  back. 6. Slowly lower your chest and your head to the floor. Back lifts Do these steps 5-10 times in a row: 1. Lie on your belly (face-down) with your arms at your sides, and rest your forehead on the floor. 2. Tighten the muscles in your legs and your butt. 3. Slowly lift your chest off of the floor while you keep your hips on the floor. Keep the back of your head in line with the curve in your back. Look at the floor while you do this. 4. Stay in this position for 3-5 seconds. 5. Slowly lower your chest and your face to the floor. Contact a doctor if:  Your back pain gets a lot worse when you do an exercise.  Your back pain does not get better 2 hours after you exercise. If you have any of these problems, stop doing the exercises. Do not do them again unless your doctor says it is okay. Get help right away if:  You have sudden, very bad back pain. If this happens, stop doing the exercises. Do not do them again unless your doctor says it is okay. This information is not intended to replace advice given to you by your health care provider. Make sure you discuss any questions you have with your health care provider. Document Revised: 12/30/2017 Document Reviewed: 12/30/2017 Elsevier Patient Education  2020 [place]. Hip Bursitis Rehab Ask your health care provider which exercises are safe for you. Do exercises exactly as told by your health care provider and adjust them as directed. It is normal to feel mild stretching, pulling, tightness, or discomfort as you do these exercises. Stop right away if you feel sudden pain or your pain gets worse. Do not begin these exercises until told by your health care provider. Stretching exercise This exercise warms up your muscles and joints and improves the movement and flexibility of your hip. This exercise also helps to relieve pain and stiffness. Iliotibial band stretch An iliotibial band is a strong band of muscle tissue that runs from the outer  side of your hip to the outer side of your thigh and knee. 1. Lie on your side with your left / right leg in the top position. 2. Bend your left / right knee and grab your ankle. Stretch out your bottom arm to help you balance. 3. Slowly bring your knee back so your thigh is behind your body. 4. Slowly lower your knee toward the floor until you feel a gentle stretch on the outside of your left / right thigh. If you do not feel a stretch and your  knee will not fall farther, place the heel of your other foot on top of your knee and pull your knee down toward the floor with your foot. 5. Hold this position for __________ seconds. 6. Slowly return to the starting position. Repeat __________ times. Complete this exercise __________ times a day. Strengthening exercises These exercises build strength and endurance in your hip and pelvis. Endurance is the ability to use your muscles for a long time, even after they get tired. Bridge This exercise strengthens the muscles that move your thigh backward (hip extensors). 1. Lie on your back on a firm surface with your knees bent and your feet flat on the floor. 2. Tighten your buttocks muscles and lift your buttocks off the floor until your trunk is level with your thighs. ? Do not arch your back. ? You should feel the muscles working in your buttocks and the back of your thighs. If you do not feel these muscles, slide your feet 1-2 inches (2.5-5 cm) farther away from your buttocks. ? If this exercise is too easy, try doing it with your arms crossed over your chest. 3. Hold this position for __________ seconds. 4. Slowly lower your hips to the starting position. 5. Let your muscles relax completely after each repetition. Repeat __________ times. Complete this exercise __________ times a day. Squats This exercise strengthens the muscles in front of your thigh and knee (quadriceps). 1. Stand in front of a table, with your feet and knees pointing straight  ahead. You may rest your hands on the table for balance but not for support. 2. Slowly bend your knees and lower your hips like you are going to sit in a chair. ? Keep your weight over your heels, not over your toes. ? Keep your lower legs upright so they are parallel with the table legs. ? Do not let your hips go lower than your knees. ? Do not bend lower than told by your health care provider. ? If your hip pain increases, do not bend as low. 3. Hold the squat position for __________ seconds. 4. Slowly push with your legs to return to standing. Do not use your hands to pull yourself to standing. Repeat __________ times. Complete this exercise __________ times a day. Hip hike 1. Stand sideways on a bottom step. Stand on your left / right leg with your other foot unsupported next to the step. You can hold on to the railing or wall for balance if needed. 2. Keep your knees straight and your torso square. Then lift your left / right hip up toward the ceiling. 3. Hold this position for __________ seconds. 4. Slowly let your left / right hip lower toward the floor, past the starting position. Your foot should get closer to the floor. Do not lean or bend your knees. Repeat __________ times. Complete this exercise __________ times a day. Single leg stand 1. Without shoes, stand near a railing or in a doorway. You may hold on to the railing or door frame as needed for balance. 2. Squeeze your left / right buttock muscles, then lift up your other foot. ? Do not let your left / right hip push out to the side. ? It is helpful to stand in front of a mirror for this exercise so you can watch your hip. 3. Hold this position for __________ seconds. Repeat __________ times. Complete this exercise __________ times a day. This information is not intended to replace advice given to you by your health  care provider. Make sure you discuss any questions you have with your health care provider. Document Revised:  08/01/2018 Document Reviewed: 08/01/2018 Elsevier Patient Education  [place].

## 2019-07-20 ENCOUNTER — Other Ambulatory Visit: Payer: Self-pay

## 2019-07-20 ENCOUNTER — Ambulatory Visit (INDEPENDENT_AMBULATORY_CARE_PROVIDER_SITE_OTHER): Payer: 59 | Admitting: Family Medicine

## 2019-07-20 VITALS — BP 138/82 | HR 84 | Temp 97.9°F | Ht 65.0 in | Wt 220.0 lb

## 2019-07-20 DIAGNOSIS — Z9189 Other specified personal risk factors, not elsewhere classified: Secondary | ICD-10-CM | POA: Diagnosis not present

## 2019-07-20 DIAGNOSIS — I10 Essential (primary) hypertension: Secondary | ICD-10-CM | POA: Diagnosis not present

## 2019-07-20 DIAGNOSIS — E8881 Metabolic syndrome: Secondary | ICD-10-CM

## 2019-07-20 DIAGNOSIS — Z6836 Body mass index (BMI) 36.0-36.9, adult: Secondary | ICD-10-CM

## 2019-07-20 MED ORDER — METFORMIN HCL 500 MG PO TABS: 500.0000 mg | ORAL_TABLET | Freq: Every day | ORAL | 0 refills | Status: DC

## 2019-07-20 NOTE — Progress Notes (Signed)
Chief Complaint:   OBESITY Lori Luna is here to discuss her progress with her obesity treatment plan along with follow-up of her obesity related diagnoses. Kla is on the Category 3 Plan or keeping a food journal and adhering to recommended goals of 1400-1600 calories and 90+ grams of protein daily and states she is following her eating plan approximately 85% of the time. Antania states she is on the elliptical and doing light upper body weights for 10 minutes 3 times per week.  Today's visit was #: 4 Starting weight: 230 lbs Starting date: 06/01/2019 Today's weight: 220 lbs Today's date: 07/20/2019 Total lbs lost to date: 10 Total lbs lost since last in-office visit: 4  Interim History: Rochella reports increase in stress eating the last 2 days due to attempting to move her mother locally. She reports feeling better, sleeping better, and that her clothes are fitting better.  Subjective:   1. Insulin resistance Arelis's insulin level on 06/01/2019 was 15.0. She is tolerating metformin well, and she denies GI upset.  2. Essential hypertension Lakenya's blood pressure is well controlled at her office visit today. She is currently on olmesartan-hydrochlorothiazide. Her ambulatory blood pressure readings systolic [id number] and diastolic [id number].  3. At risk for diabetes mellitus Lafonda is at higher than average risk for developing diabetes due to her obesity.   Assessment/Plan:   1. Insulin resistance Corry will continue to work on weight loss, exercise, and decreasing simple carbohydrates to help decrease the risk of diabetes. We will refill metformin for 1 month. We will recheck labs in May 2021. Lurleen agreed to follow-up with [name] as directed to closely monitor her progress.  - metFORMIN (GLUCOPHAGE) 500 MG tablet; Take 1 tablet (500 mg total) by mouth daily with breakfast.  Dispense: 30 tablet; Refill: 0  2. Essential hypertension Yanitzia is working on healthy weight loss and  exercise to improve blood pressure control. We will watch for signs of hypotension as she continues her lifestyle modifications. Amberlyn will continue her current anti-hypertensive therapy.  3. At risk for diabetes mellitus Shenoa was given approximately 15 minutes of diabetes education and counseling today. We discussed intensive lifestyle modifications today with an emphasis on weight loss as well as increasing exercise and decreasing simple carbohydrates in her diet. We also reviewed medication options with an emphasis on risk versus benefit of those discussed.   Repetitive spaced learning was employed today to elicit superior memory formation and behavioral change.  4. Class 2 severe obesity with serious comorbidity and body mass index (BMI) of 36.0 to 36.9 in adult, unspecified obesity type (HCC) Louvella is currently in the action stage of change. As such, her goal is to continue with weight loss efforts. She has agreed to the Category 3 Plan.   Exercise goals: As is.  Behavioral modification strategies: increasing lean protein intake and no skipping meals.  Lorrieann has agreed to follow-up with our clinic in 2 weeks. She was informed of the importance of frequent follow-up visits to maximize her success with intensive lifestyle modifications for her multiple health conditions.   Objective:   Blood pressure 138/82, pulse 84, temperature 97.9 F (36.6 C), temperature source Oral, height 5\' 5"  (1.651 m), weight 220 lb (99.8 kg), last menstrual period 10/19/2018, SpO2 100 %. Body mass index is 36.61 kg/m.  General: Cooperative, alert, well developed, in no acute distress. HEENT: Conjunctivae and lids unremarkable. Cardiovascular: Regular rhythm.  Lungs: Normal work of breathing. Neurologic: No focal deficits.   Lab Results  Component Value Date   CREATININE 0.86 06/01/2019   BUN 14 06/01/2019   NA 141 06/01/2019   K 4.4 06/01/2019   CL 103 06/01/2019   CO2 24 06/01/2019   Lab  Results  Component Value Date   ALT 34 (H) 06/01/2019   AST 19 06/01/2019   ALKPHOS 90 06/01/2019   BILITOT 0.3 06/01/2019   Lab Results  Component Value Date   HGBA1C 5.5 05/04/2019   Lab Results  Component Value Date   INSULIN 15.0 06/01/2019   Lab Results  Component Value Date   TSH 3.230 05/04/2019   No results found for: CHOL, HDL, LDLCALC, LDLDIRECT, TRIG, CHOLHDL Lab Results  Component Value Date   WBC 4.6 05/04/2019   HGB 14.5 05/04/2019   HCT 43.5 05/04/2019   MCV 87 05/04/2019   PLT 306 05/04/2019   No results found for: IRON, TIBC, FERRITIN  Attestation Statements:   Reviewed by clinician on day of visit: allergies, medications, problem list, medical history, surgical history, family history, social history, and previous encounter notes.   I, Trixie Dredge, am acting as transcriptionist for Dennard Nip, MD.  I have reviewed the above documentation for accuracy and completeness, and I agree with the above. -  Dennard Nip, MD

## 2019-08-03 ENCOUNTER — Ambulatory Visit (INDEPENDENT_AMBULATORY_CARE_PROVIDER_SITE_OTHER): Payer: 59 | Admitting: Family Medicine

## 2019-08-14 ENCOUNTER — Ambulatory Visit (INDEPENDENT_AMBULATORY_CARE_PROVIDER_SITE_OTHER): Payer: 59 | Admitting: Family Medicine

## 2019-08-17 ENCOUNTER — Ambulatory Visit (INDEPENDENT_AMBULATORY_CARE_PROVIDER_SITE_OTHER): Payer: 59 | Admitting: Family Medicine

## 2019-08-18 ENCOUNTER — Other Ambulatory Visit (INDEPENDENT_AMBULATORY_CARE_PROVIDER_SITE_OTHER): Payer: Self-pay | Admitting: Family Medicine

## 2019-08-18 DIAGNOSIS — E8881 Metabolic syndrome: Secondary | ICD-10-CM

## 2019-08-21 ENCOUNTER — Encounter (INDEPENDENT_AMBULATORY_CARE_PROVIDER_SITE_OTHER): Payer: Self-pay

## 2019-08-24 ENCOUNTER — Other Ambulatory Visit: Payer: Self-pay

## 2019-08-24 ENCOUNTER — Ambulatory Visit (INDEPENDENT_AMBULATORY_CARE_PROVIDER_SITE_OTHER): Payer: 59 | Admitting: Family Medicine

## 2019-08-24 ENCOUNTER — Encounter (INDEPENDENT_AMBULATORY_CARE_PROVIDER_SITE_OTHER): Payer: Self-pay | Admitting: Family Medicine

## 2019-08-24 VITALS — BP 143/83 | HR 73 | Temp 98.1°F | Ht 65.0 in | Wt 222.0 lb

## 2019-08-24 DIAGNOSIS — E8881 Metabolic syndrome: Secondary | ICD-10-CM

## 2019-08-24 DIAGNOSIS — Z9189 Other specified personal risk factors, not elsewhere classified: Secondary | ICD-10-CM

## 2019-08-24 DIAGNOSIS — I10 Essential (primary) hypertension: Secondary | ICD-10-CM | POA: Diagnosis not present

## 2019-08-24 DIAGNOSIS — Z6837 Body mass index (BMI) 37.0-37.9, adult: Secondary | ICD-10-CM

## 2019-08-24 MED ORDER — METFORMIN HCL 500 MG PO TABS: 500.0000 mg | ORAL_TABLET | Freq: Every day | ORAL | 0 refills | Status: DC

## 2019-08-24 NOTE — Progress Notes (Signed)
Chief Complaint:   OBESITY Lori Luna is here to discuss her progress with her obesity treatment plan along with follow-up of her obesity related diagnoses. [name] is on the Category 3 Plan and states she is following her eating plan approximately 30% of the time. [name] states she is doing yoga for 60 minutes 2 times per week.  Today's visit was #: 5 Starting weight: 230 lbs Starting date: 06/01/2019 Today's weight: 222 lbs Today's date: 08/24/2019 Total lbs lost to date: 8 Total lbs lost since last in-office visit: 0  Interim History: Lori Luna is transitioning her mom to move here from [place], and she has not had time to meal prep. She has eaten out at fast food quite often. She notes hunger at night since eating less protein. She now will be able to cook at home more for the next few weeks.  Subjective:   1. Insulin resistance Lori Luna is on metformin, and she notes polyphagia at night when intake of protein is poor. Lab Results  Component Value Date   HGBA1C 5.5 05/04/2019   2. Essential hypertension Lori Luna's blood pressure is a bit high today. Her blood pressures run at 130's/80's at home. She notes her blood pressure has crept up. She wonders if caffeine is causing this. BP Readings from Last 3 Encounters:  08/24/19 (!) 143/83  07/20/19 138/82  07/18/19 127/90    3. At risk for diabetes mellitus Lori Luna is at higher than average risk for developing diabetes due to her obesity.   Assessment/Plan:   1. Insulin resistance Lori Luna will continue to work on weight loss, exercise, and decreasing simple carbohydrates to help decrease the risk of diabetes. We will refill metformin for 1 month. [name] agreed to follow-up with [name] as directed to closely monitor her progress.  - metFORMIN (GLUCOPHAGE) 500 MG tablet; Take 1 tablet (500 mg total) by mouth daily with breakfast.  Dispense: 30 tablet; Refill: 0  2. Essential hypertension Lori Luna is working on healthy weight loss  and exercise to improve blood pressure control.  Charlsie will continue Benicar, and she will decrease her caffeine.  3. At risk for diabetes mellitus Lori Luna was given approximately 15 minutes of diabetes education and counseling today. We discussed intensive lifestyle modifications today with an emphasis on weight loss as well as increasing exercise and decreasing simple carbohydrates in her diet. We also reviewed medication options with an emphasis on risk versus benefit of those discussed.   Repetitive spaced learning was employed today to elicit superior memory formation and behavioral change.  4. Class 2 severe obesity with serious comorbidity and body mass index (BMI) of 37.0 to 37.9 in adult, unspecified obesity type (HCC) Lori Luna is currently in the action stage of change. As such, her goal is to continue with weight loss efforts. She has agreed to the Category 3 Plan.   Exercise goals: As is.  Behavioral modification strategies: increasing lean protein intake, decreasing simple carbohydrates and decreasing eating out.  Lori Luna has agreed to follow-up with our clinic in 2 weeks. She was informed of the importance of frequent follow-up visits to maximize her success with intensive lifestyle modifications for her multiple health conditions.   Objective:   Blood pressure (!) 143/83, pulse 73, temperature 98.1 F (36.7 C), temperature source Oral, height 5\' 5"  (1.651 m), weight 222 lb (100.7 kg), last menstrual period 10/19/2018, SpO2 99 %. Body mass index is 36.94 kg/m.  General: Cooperative, alert, well developed, in no acute distress. HEENT: Conjunctivae and lids unremarkable.  Cardiovascular: Regular rhythm.  Lungs: Normal work of breathing. Neurologic: No focal deficits.   Lab Results  Component Value Date   CREATININE 0.86 06/01/2019   BUN 14 06/01/2019   NA 141 06/01/2019   K 4.4 06/01/2019   CL 103 06/01/2019   CO2 24 06/01/2019   Lab Results  Component Value Date    ALT 34 (H) 06/01/2019   AST 19 06/01/2019   ALKPHOS 90 06/01/2019   BILITOT 0.3 06/01/2019   Lab Results  Component Value Date   HGBA1C 5.5 05/04/2019   Lab Results  Component Value Date   INSULIN 15.0 06/01/2019   Lab Results  Component Value Date   TSH 3.230 05/04/2019   No results found for: CHOL, HDL, LDLCALC, LDLDIRECT, TRIG, CHOLHDL Lab Results  Component Value Date   WBC 4.6 05/04/2019   HGB 14.5 05/04/2019   HCT 43.5 05/04/2019   MCV 87 05/04/2019   PLT 306 05/04/2019   No results found for: IRON, TIBC, FERRITIN  Attestation Statements:   Reviewed by clinician on day of visit: allergies, medications, problem list, medical history, surgical history, family history, social history, and previous encounter notes.   Wilhemena Durie, am acting as Location manager for [employer], FNP-C.  I have reviewed the above documentation for accuracy and completeness, and I agree with the above. -  Georgianne Fick, FNP

## 2019-08-28 ENCOUNTER — Encounter (INDEPENDENT_AMBULATORY_CARE_PROVIDER_SITE_OTHER): Payer: Self-pay | Admitting: Family Medicine

## 2019-08-28 DIAGNOSIS — E88819 Insulin resistance, unspecified: Secondary | ICD-10-CM | POA: Insufficient documentation

## 2019-08-28 DIAGNOSIS — E8881 Metabolic syndrome: Secondary | ICD-10-CM | POA: Insufficient documentation

## 2019-09-12 ENCOUNTER — Ambulatory Visit (INDEPENDENT_AMBULATORY_CARE_PROVIDER_SITE_OTHER): Payer: 59 | Admitting: Family Medicine

## 2019-09-12 ENCOUNTER — Encounter (INDEPENDENT_AMBULATORY_CARE_PROVIDER_SITE_OTHER): Payer: Self-pay

## 2019-11-20 ENCOUNTER — Encounter (INDEPENDENT_AMBULATORY_CARE_PROVIDER_SITE_OTHER): Payer: Self-pay | Admitting: Family Medicine

## 2019-11-20 ENCOUNTER — Ambulatory Visit (INDEPENDENT_AMBULATORY_CARE_PROVIDER_SITE_OTHER): Payer: 59 | Admitting: Family Medicine

## 2019-11-20 ENCOUNTER — Other Ambulatory Visit: Payer: Self-pay

## 2019-11-20 VITALS — BP 140/91 | HR 77 | Temp 98.0°F | Ht 65.0 in | Wt 223.0 lb

## 2019-11-20 DIAGNOSIS — Z9189 Other specified personal risk factors, not elsewhere classified: Secondary | ICD-10-CM

## 2019-11-20 DIAGNOSIS — R7303 Prediabetes: Secondary | ICD-10-CM

## 2019-11-20 DIAGNOSIS — Z6837 Body mass index (BMI) 37.0-37.9, adult: Secondary | ICD-10-CM

## 2019-11-20 DIAGNOSIS — F3289 Other specified depressive episodes: Secondary | ICD-10-CM

## 2019-11-20 MED ORDER — METFORMIN HCL 500 MG PO TABS: 500.0000 mg | ORAL_TABLET | Freq: Every day | ORAL | 0 refills | Status: DC

## 2019-11-21 NOTE — Progress Notes (Signed)
Chief Complaint:   OBESITY Lori Luna is here to discuss her progress with her obesity treatment plan along with follow-up of her obesity related diagnoses. Lori Luna is on the Category 3 Plan and states she is following her eating plan approximately 10% of the time. Lori Luna states she is doing yoga for 60 minutes 2 times per week.  Today's visit was #: 6 Starting weight: 230 lbs Starting date: 06/01/2019 Today's weight: 223 lbs Today's date: 11/20/2019 Total lbs lost to date: 7 Total lbs lost since last in-office visit: 0  Interim History: Lori Luna's last visit was approximately 3 months ago. She has had a lot of changes in her life and healthy eating has gone by the wayside. She is ready to get back on track.  Subjective:   1. Pre-diabetes Lori Luna is stable on metformin, and she denies nausea or vomiting. She has been off metformin for a few weeks and would like to restart.  2. Other depression with emotional eating Lori Luna notes struggling with stress eating and emotional eating. She wonders if discussing this with a Psychologist would be helpful.  3. At risk for impaired metabolic function Lori Luna is at increased risk for impaired metabolic function due to decreased protein.  Assessment/Plan:   1. Pre-diabetes Lori Luna will continue to work on weight loss, diet, exercise, and decreasing simple carbohydrates to help decrease the risk of diabetes. We will refill metformin for 1 month.  - metFORMIN (GLUCOPHAGE) 500 MG tablet; Take 1 tablet (500 mg total) by mouth daily with breakfast.  Dispense: 30 tablet; Refill: 0  2. Other depression with emotional eating Behavior modification techniques were discussed today to help Lori Luna deal with her emotional/non-hunger eating behaviors. We will refer to Dr. Mallie Mussel, our Bariatric Psychologist for evaluation. Orders and follow up as documented in patient record.   3. At risk for impaired metabolic function Lori Luna was given approximately 15  minutes of impaired  metabolic function prevention counseling today. We discussed intensive lifestyle modifications today with an emphasis on specific nutrition and exercise instructions and strategies.   Repetitive spaced learning was employed today to elicit superior memory formation and behavioral change.  4. Class 2 severe obesity with serious comorbidity and body mass index (BMI) of 37.0 to 37.9 in adult, unspecified obesity type (HCC) Lori Luna is currently in the action stage of change. As such, her goal is to get back to weightloss efforts . She has agreed to the Category 3 Plan.   Exercise goals: As is.  Behavioral modification strategies: increasing lean protein intake and meal planning and cooking strategies.  Lori Luna has agreed to follow-up with our clinic in 2 to 3 weeks. She was informed of the importance of frequent follow-up visits to maximize her success with intensive lifestyle modifications for her multiple health conditions.   Objective:   Blood pressure (!) 140/91, pulse 77, temperature 98 F (36.7 C), temperature source Oral, height 5\' 5"  (1.651 m), weight 223 lb (101.2 kg), last menstrual period 10/19/2018, SpO2 98 %. Body mass index is 37.11 kg/m.  General: Cooperative, alert, well developed, in no acute distress. HEENT: Conjunctivae and lids unremarkable. Cardiovascular: Regular rhythm.  Lungs: Normal work of breathing. Neurologic: No focal deficits.   Lab Results  Component Value Date   CREATININE 0.86 06/01/2019   BUN 14 06/01/2019   NA 141 06/01/2019   K 4.4 06/01/2019   CL 103 06/01/2019   CO2 24 06/01/2019   Lab Results  Component Value Date   ALT 34 (H) 06/01/2019  AST 19 06/01/2019   ALKPHOS 90 06/01/2019   BILITOT 0.3 06/01/2019   Lab Results  Component Value Date   HGBA1C 5.5 05/04/2019   Lab Results  Component Value Date   INSULIN 15.0 06/01/2019   Lab Results  Component Value Date   TSH 3.230 05/04/2019   No results found for:  CHOL, HDL, LDLCALC, LDLDIRECT, TRIG, CHOLHDL Lab Results  Component Value Date   WBC 4.6 05/04/2019   HGB 14.5 05/04/2019   HCT 43.5 05/04/2019   MCV 87 05/04/2019   PLT 306 05/04/2019   No results found for: IRON, TIBC, FERRITIN  Attestation Statements:   Reviewed by clinician on day of visit: allergies, medications, problem list, medical history, surgical history, family history, social history, and previous encounter notes.   I, Trixie Dredge, am acting as transcriptionist for Dennard Nip, MD.  I have reviewed the above documentation for accuracy and completeness, and I agree with the above. -  Dennard Nip, MD

## 2019-12-04 NOTE — Progress Notes (Signed)
Office: [phone]  /  Fax: [phone]    Date: December 18, 2019    Appointment Start Time: 11:00am Duration: 32 minutes Provider: Glennie Isle, Psy.D. Type of Session: Intake for Individual Therapy  Location of Patient: Mom's house in [place] Location of Provider: [facility] Type of Contact: Telepsychological Visit via MyChart Video Visit  Informed Consent: Prior to proceeding with today's appointment, two pieces of identifying information were obtained. In addition, Lori Luna's physical location at the time of this appointment was obtained as well a phone number she could be reached at in the event of technical difficulties. Lori Luna and this provider participated in today's telepsychological service.   The provider's role was explained to Lori Luna. The provider reviewed and discussed issues of confidentiality, privacy, and limits therein (e.g., reporting obligations). In addition to verbal informed consent, written informed consent for psychological services was obtained prior to the initial appointment. Since the clinic is not a 24/7 crisis center, mental health emergency resources were shared and this  provider explained MyChart, e-mail, voicemail, and/or other messaging systems should be utilized only for non-emergency reasons. This provider also explained that information obtained during appointments will be placed in Lori Luna's medical record and relevant information will be shared with other providers at [facility] for coordination of care. Moreover, Demya agreed information may be shared with other [facility] providers as needed for coordination of care. By signing the service agreement document, Lori Luna provided written consent for coordination of care. Prior to initiating telepsychological services, Verner completed an informed consent document, which included the development of a safety plan (i.e., an emergency  contact, nearest emergency room, and emergency resources) in the event of an emergency/crisis. Lori Luna expressed understanding of the rationale of the safety plan. Lori Luna verbally acknowledged understanding she is ultimately responsible for understanding her insurance benefits for telepsychological and in-person services. This provider also reviewed confidentiality, as it relates to telepsychological services, as well as the rationale for telepsychological services (i.e., to reduce exposure risk to COVID-19). Lori Luna  acknowledged understanding that appointments cannot be recorded without both party consent and she is aware she is responsible for securing confidentiality on her end of the session. Lori Luna verbally consented to proceed.  Chief Complaint/HPI: Lori Luna was referred by Dr. Dennard Nip due to other depression, with emotional eating. Per the note for the visit with Dr. Dennard Nip on November 20, 2019, "Antasia notes struggling with stress eating and emotional eating. She wonders if discussing this with a Psychologist would be helpful." The note for the initial appointment with Dr. Dennard Nip on June 01, 2019 indicated the following: "Annleigh's habits were reviewed today and are as follows: Her family eats meals together, she thinks her family will eat healthier with her, her desired weight loss is 50 lbs, she started gaining weight during menopause period (2 years), her heaviest weight ever was 230 pounds, she has significant food cravings issues, she snacks frequently in the evenings, she wakes up frequently in the middle of the night to eat, she skips meals frequently, she is frequently drinking liquids with calories, she frequently makes poor food choices, she has problems with excessive hunger and she struggles with emotional eating." Younique's Food and Mood (modified PHQ-9) score on June 01, 2019 was 11.  During today's appointment, Lori Luna was verbally administered a questionnaire  assessing various behaviors related to emotional eating. Lori Luna endorsed the following: experience food cravings on a regular basis, eat certain foods when you are  anxious, stressed, depressed, or your feelings are hurt, use food to help you cope with emotional situations, find food is comforting to you, overeat when you are angry or upset and overeat when you are worried about something. She shared she craves sugar (chocolate and regular Coke) and fast food. Lori Luna believes the onset of emotional eating was likely during child rearing years. She stated when she started with the clinic, she was doing "well" with cravings. However, Lori Luna explained she started a new job in May 2021, noting it triggered cravings and emotional eating. In addition, Lori Luna endorsed a history of engaging in binge eating behaviors. She stated she can eat a "quart of ice cream," noting the frequency as once in the past six months. Lori Luna denied a history of restricting food intake, purging and engagement in other compensatory strategies, and has never been diagnosed with an eating disorder. She also denied a history of treatment for emotional eating. Currently, Kayleana indicated stress and feeling anxious triggers emotional/binge eating, whereas stretching and taking a few minutes to stop and breathe makes emotional/binge eating better. Furthermore, Lori Luna denied other problems of concern.   Mental Status Examination:  Appearance: well groomed and appropriate hygiene  Behavior: appropriate to circumstances Mood: euthymic Affect: mood congruent Speech: normal in rate, volume, and tone Eye Contact: appropriate Psychomotor Activity: appropriate Gait: unable to assess Thought Process: linear, logical, and goal directed  Thought Content/Perception: denies suicidal and homicidal ideation, plan, and intent and no hallucinations, delusions, bizarre thinking or behavior reported or observed Orientation: time, person, place, and  purpose of appointment Memory/Concentration: memory, attention, language, and fund of knowledge intact  Insight/Judgment: good  Family & Psychosocial History: Lori Luna reported she is married and she has three children (ages 65, 41, and 25). She indicated she is currently employed with a [employer], noting a recent reduction in work stress. Additionally, Lori Luna shared her highest level of education obtained is "three years of college." Currently, Lori Luna's social support system consists of her "five besties and ten in [her] little tribe." Moreover, Lori Luna stated she resides with her husband.   Medical History:  Past Medical History:  Diagnosis Date  . Anxiety   . Atypical chest pain 06/19/2016  . Back pain   . Brain fog   . DDD (degenerative disc disease), lumbar   . Dyspnea   . Essential hypertension 06/19/2016  . Family history of degenerative disc disease   . Gallbladder problem   . Hot flashes   . Hyperlipidemia   . Hyperlipidemia 06/19/2016  . Hypertension   . Joint pain   . Lower extremity edema   . Menopause   . Trouble in sleeping   . Vitamin D deficiency   . Weight gain    Past Surgical History:  Procedure Laterality Date  . GALLBLADDER SURGERY     Current Outpatient Medications on File Prior to Visit  Medication Sig Dispense Refill  . Cholecalciferol (VITAMIN D) 50 MCG (2000 UT) CAPS Take 1 capsule by mouth daily.    Marland Kitchen escitalopram (LEXAPRO) 10 MG tablet Take 10 mg by mouth daily.     Marland Kitchen ibuprofen (ADVIL) 800 MG tablet Take 800 mg by mouth 3 (three) times daily.    . Magnesium 400 MG TABS Take 1 tablet by mouth daily.    . meloxicam (MOBIC) 7.5 MG tablet Take 7.5 mg by mouth daily.    . metFORMIN (GLUCOPHAGE) 500 MG tablet Take 1 tablet (500 mg total) by mouth daily with breakfast. 30 tablet 0  .  olmesartan-hydrochlorothiazide (BENICAR HCT) 40-12.5 MG tablet Take 1 tablet by mouth daily. 90 tablet 3  . traMADol (ULTRAM) 50 MG tablet Take 50 mg by mouth every 6 (six)  hours as needed for pain.     No current facility-administered medications on file prior to visit.   Mental Health History: Lori Luna reported she attended therapeutic services due to marital concerns from 2017 to 2019. Currently, she indicated her PCP prescribes Lexapro. Lori Luna reported there is no history of hospitalizations for psychiatric concerns. Lori Luna denied a family history of mental health related concerns. Lori Luna reported there is no history of trauma including psychological, physical  and sexual abuse, as well as neglect.   Lori Luna described her typical mood lately as "still anxious and stressed," noting she is helping her mother move. Adaliah endorsed current alcohol use (e.g., two standard drinks a week). She denied tobacco use. She denied illicit/recreational substance use. Regarding caffeine intake, Laiana reported consuming unsweet green tea daily and Coke (one mini can) every other day, adding her Coke intake can increase based on stressors. Furthermore, Lori Luna indicated she is not experiencing the following: hallucinations and delusions, paranoia, symptoms of mania , social withdrawal, crying spells, panic attacks and decreased motivation. She also denied history of and current suicidal ideation, plan, and intent; history of and current homicidal ideation, plan, and intent; and history of and current engagement in self-harm.  The following strengths were reported by [facility]: "multitasking;" "pretty organized;" and "happy go lucky." The following strengths were observed by this provider: ability to express thoughts and feelings during the therapeutic session, ability to establish and benefit from a therapeutic relationship, willingness to work toward established goal(s) with the clinic and ability to engage in reciprocal conversation.   Legal History: Byron reported there is no history of legal involvement.   Structured Assessments Results: The Patient Health Questionnaire-9  (PHQ-9) is a self-report measure that assesses symptoms and severity of depression over the course of the last two weeks. Ethelle obtained a score of 1 suggesting minimal depression. Ahnya finds the endorsed symptoms to be not difficult at all. [0= Not at all; 1= Several days; 2= More than half the days; 3= Nearly every day] Little interest or pleasure in doing things 0  Feeling down, depressed, or hopeless 0  Trouble falling or staying asleep, or sleeping too much 0  Feeling tired or having little energy 0  Poor appetite or overeating 1  Feeling bad about yourself --- or that you are a failure or have let yourself or your family down 0  Trouble concentrating on things, such as reading the newspaper or watching television 0  Moving or speaking so slowly that other people could have noticed? Or the opposite --- being so fidgety or restless that you have been moving around a lot more than usual 0  Thoughts that you would be better off dead or hurting yourself in some way 0  PHQ-9 Score 1    The Generalized Anxiety Disorder-7 (GAD-7) is a brief self-report measure that assesses symptoms of anxiety over the course of the last two weeks. Yiselle obtained a score of 2 suggesting minimal anxiety. Devynne finds the endorsed symptoms to be not difficult at all. [0= Not at all; 1= Several days; 2= Over half the days; 3= Nearly every day] Feeling nervous, anxious, on edge 1  Not being able to stop or control worrying 0  Worrying too much about different things 0  Trouble relaxing 0  Being so restless that it's hard  to sit still 1  Becoming easily annoyed or irritable 0  Feeling afraid as if something awful might happen 0  GAD-7 Score 2   Interventions:  Conducted a chart review Focused on rapport building Verbally administered PHQ-9 and GAD-7 for symptom monitoring Verbally administered Food & Mood questionnaire to assess various behaviors related to emotional eating Provided emphatic reflections  and validation Collaborated with patient on a treatment goal  Psychoeducation provided regarding physical versus emotional hunger  Provisional DSM-5 Diagnosis(es): 300.09 (F41.8) Other Specified Anxiety Disorder, Emotional Eating Behaviors  Plan: Saron appears able and willing to participate as evidenced by collaboration on a treatment goal, engagement in reciprocal conversation, and asking questions as needed for clarification. The next appointment will be scheduled in 2-3 weeks, which will be via MyChart Video Visit. The following treatment goal was established: increase coping skills. This provider will regularly review the treatment plan and medical chart to keep informed of status changes. Leeta expressed understanding and agreement with the initial treatment plan of care. Keatyn will be sent a handout via e-mail to utilize between now and the next appointment to increase awareness of hunger patterns and subsequent eating. Malyna provided verbal consent during today's appointment for this provider to send the handout via e-mail.

## 2019-12-14 ENCOUNTER — Ambulatory Visit (INDEPENDENT_AMBULATORY_CARE_PROVIDER_SITE_OTHER): Payer: 59 | Admitting: Family Medicine

## 2019-12-18 ENCOUNTER — Telehealth (INDEPENDENT_AMBULATORY_CARE_PROVIDER_SITE_OTHER): Payer: 59 | Admitting: Psychology

## 2019-12-18 ENCOUNTER — Other Ambulatory Visit: Payer: Self-pay

## 2019-12-18 DIAGNOSIS — F418 Other specified anxiety disorders: Secondary | ICD-10-CM

## 2019-12-18 NOTE — Progress Notes (Signed)
Cardiology Clinic Note   Patient Name: Keenan Dimitrov Date of Encounter: 12/21/2019  Primary Care Provider:  Cari Caraway, MD Primary Cardiologist:  Skeet Latch, MD  Patient Profile    Percell Boston. Pardue 54 year old female presents to the clinic today for follow-up evaluation of her hypertension.  Past Medical History    Past Medical History:  Diagnosis Date  . Anxiety   . Atypical chest pain 06/19/2016  . Back pain   . Brain fog   . DDD (degenerative disc disease), lumbar   . Dyspnea   . Essential hypertension 06/19/2016  . Family history of degenerative disc disease   . Gallbladder problem   . Hot flashes   . Hyperlipidemia   . Hyperlipidemia 06/19/2016  . Hypertension   . Joint pain   . Lower extremity edema   . Menopause   . Trouble in sleeping   . Vitamin D deficiency   . Weight gain    Past Surgical History:  Procedure Laterality Date  . GALLBLADDER SURGERY      Allergies  Allergies  Allergen Reactions  . Amoxicillin-Pot Clavulanate Diarrhea    History of Present Illness    Ms. Spira has a PMH of essential hypertension, HLD, atypical chest pain, insulin resistance, family history of CAD, and obesity.  She was last seen by Dr. Oval Linsey on 06/19/2019.  She had been previously seen in March 2018 for cardiovascular risk assessment.  She underwent coronary CTA April 2018 which showed a calcium score of 0.  Her blood pressure was elevated at that time and enalapril was switched to olmesartan.  She was also noted to have a cough with enalapril which improved with changing her medication.  She was hospitalized for withdrawal of her gabapentin.  She was noted to have increased her walking 3-5 miles several days a week.  However, she was frustrated because she had not been losing weight despite her increase physical activity.  She was referred to the community health and wellness clinic which she found to be helpful.  Her blood pressure readings at clinic  showed [id number] systolic.  However, on check during her follow-up hypertension visit her blood pressure was low.  She had noted some increased heart rates through the day.  During her prior visit her blood pressure was noted to be poorly controlled and her telmisartan was increased.  She was subsequently switched to telmisartan/ HCTZ.  During her follow-up visit her blood pressure was well controlled.  Her medications were continued and she was instructed to follow-up with Leslie MG Pharm.D. for medication management.  She presents to the clinic today for follow-up evaluation and states she feels well.  She has stopped walking due to a left metatarsal fracture that happened in November and recurred in the spring.  She paused her work with [facility] weight and wellness over the summer.  She had a consultation last week and will begin working with [facility] weight and once again next week.  She continues to follow a high-protein diet.  Her blood pressures been well controlled at home 130s over 80s.  States she has some increased stress with moving her mother.  However, this is coming to an end.  She will also be going for a sleep study for evaluation of OSA.  I will give her the mindfulness stress reduction sheet, salty 6 diet sheet, have her continue to monitor blood pressure and follow-up in 6 months.  Today she denies chest pain, shortness of breath, lower  extremity edema, fatigue, palpitations, melena, hematuria, hemoptysis, diaphoresis, weakness, presyncope, syncope, orthopnea, and PND.  Home Medications    Prior to Admission medications   Medication Sig Start Date End Date Taking? Authorizing Provider  Cholecalciferol (VITAMIN D) 50 MCG (2000 UT) CAPS Take 1 capsule by mouth daily.    [provider]  escitalopram (LEXAPRO) 10 MG tablet Take 10 mg by mouth daily.  07/26/17   [provider]  ibuprofen (ADVIL) 800 MG tablet Take 800 mg by mouth 3 (three) times daily. 09/05/18    [provider]  Magnesium 400 MG TABS Take 1 tablet by mouth daily.    [provider]  meloxicam (MOBIC) 7.5 MG tablet Take 7.5 mg by mouth daily. 05/15/19   [provider]  metFORMIN (GLUCOPHAGE) 500 MG tablet Take 1 tablet (500 mg total) by mouth daily with breakfast. 11/20/19   Dennard Nip D, MD  olmesartan-hydrochlorothiazide (BENICAR HCT) 40-12.5 MG tablet Take 1 tablet by mouth daily. 02/13/19   Skeet Latch, MD  traMADol (ULTRAM) 50 MG tablet Take 50 mg by mouth every 6 (six) hours as needed for pain. 09/05/18   [provider]    Family History    Family History  Problem Relation Age of Onset  . Hypertension Mother   . Anxiety disorder Mother   . Hypertension Father   . CAD Father   . Psoriasis Father   . Hyperlipidemia Father   . Cancer Father   . Diabetes Maternal Grandmother   . CAD Maternal Grandmother   . Cancer Paternal Grandmother   . Cancer Paternal Grandfather   . Stroke Maternal Aunt   . Healthy Brother   . Healthy Son   . Healthy Son   . Autoimmune disease Son   . Psoriasis Son   . Vitiligo Son   . Breast cancer Neg Hx    She indicated that her mother is alive. She indicated that her father is alive. She indicated that her brother is alive. She indicated that her maternal grandmother is deceased. She indicated that her maternal grandfather is deceased. She indicated that her paternal grandmother is deceased. She indicated that her paternal grandfather is deceased. She indicated that all of her three sons are alive. She indicated that the status of her maternal aunt is unknown. She indicated that the status of her neg hx is unknown.  Social History    Social History   Socioeconomic History  . Marital status: Married    Spouse name: Stage manager  . Number of children: Not on file  . Years of education: Not on file  . Highest education level: Not on file  Occupational History  . Occupation: Optometrist  Tobacco  Use  . Smoking status: Never Smoker  . Smokeless tobacco: Never Used  Vaping Use  . Vaping Use: Never used  Substance and Sexual Activity  . Alcohol use: Yes    Alcohol/week: 2.0 standard drinks    Types: 2 Glasses of wine per week    Comment: a couple of times weekly  . Drug use: No  . Sexual activity: Yes    Birth control/protection: Post-menopausal  Other Topics Concern  . Not on file  Social History Narrative  . Not on file   Social Determinants of Health   Financial Resource Strain:   . Difficulty of Paying Living Expenses: Not on file  Food Insecurity:   . Worried About Charity fundraiser in the Last Year: Not on file  .  Ran Out of Food in the Last Year: Not on file  Transportation Needs:   . Lack of Transportation (Medical): Not on file  . Lack of Transportation (Non-Medical): Not on file  Physical Activity:   . Days of Exercise per Week: Not on file  . Minutes of Exercise per Session: Not on file  Stress:   . Feeling of Stress : Not on file  Social Connections:   . Frequency of Communication with Friends and Family: Not on file  . Frequency of Social Gatherings with Friends and Family: Not on file  . Attends Religious Services: Not on file  . Active Member of Clubs or Organizations: Not on file  . Attends Archivist Meetings: Not on file  . Marital Status: Not on file  Intimate Partner Violence:   . Fear of Current or Ex-Partner: Not on file  . Emotionally Abused: Not on file  . Physically Abused: Not on file  . Sexually Abused: Not on file     Review of Systems    General:  No chills, fever, night sweats or weight changes.  Cardiovascular:  No chest pain, dyspnea on exertion, edema, orthopnea, palpitations, paroxysmal nocturnal dyspnea. Dermatological: No rash, lesions/masses Respiratory: No cough, dyspnea Urologic: No hematuria, dysuria Abdominal:   No nausea, vomiting, diarrhea, bright red blood per rectum, melena, or  hematemesis Neurologic:  No visual changes, wkns, changes in mental status. All other systems reviewed and are otherwise negative except as noted above.  Physical Exam    VS:  BP 130/80 (BP Location: Left Arm, Patient Position: Sitting, Cuff Size: Large)   Pulse 70   Ht 5\' 6"  (1.676 m)   Wt 228 lb 6.4 oz (103.6 kg)   LMP 10/19/2018 (Exact Date)   BMI 36.86 kg/m  , BMI Body mass index is 36.86 kg/m. GEN: Well nourished, well developed, in no acute distress. HEENT: normal. Neck: Supple, no JVD, carotid bruits, or masses. Cardiac: RRR, no murmurs, rubs, or gallops. No clubbing, cyanosis, edema.  Radials/DP/PT 2+ and equal bilaterally.  Respiratory:  Respirations regular and unlabored, clear to auscultation bilaterally. GI: Soft, nontender, nondistended, BS + x 4. MS: no deformity or atrophy. Skin: warm and dry, no rash. Neuro:  Strength and sensation are intact. Psych: Normal affect.  Accessory Clinical Findings    Recent Labs: 05/04/2019: Hemoglobin 14.5; Platelets 306; TSH 3.230 06/01/2019: ALT 34; BUN 14; Creatinine, Ser 0.86; Magnesium 2.1; Potassium 4.4; Sodium 141   Recent Lipid Panel No results found for: CHOL, TRIG, HDL, CHOLHDL, VLDL, LDLCALC, LDLDIRECT  ECG personally reviewed by me today-none today.  EKG 06/01/2019 Normal sinus rhythm 80 bpm  Coronary CTA 07/29/2016 1. Coronary calcium score of 0. This was 0 percentile for age and sex matched control.  2. Normal coronary origin with right dominance.  3. No evidence of CAD.  Assessment & Plan   1.  Essential hypertension-BP today 130/80.  Much better control at home. Continue Benicar 40-12 0.5 Heart healthy low-sodium diet-salty 6 given Increase physical activity as tolerated Avoid secondary causes of hypertension-discuss  Atypical chest pain-no chest pain today.  Coronary CTA April 2018 showed calcium score of 0. Continue to monitor Heart healthy low-sodium diet-salty 6 given Increase physical activity  as tolerated  HLD-3.2% 10-year ASCVD risk Heart healthy low-sodium high-fiber diet Increase physical activity as tolerated  Obesity-weight today 228.4.  Continues to work with health and wellness clinic. Continue current diet Increase physical activity as tolerated  Disposition: Follow up with Dr. Oval Linsey  in 6 months.   Jossie Ng. Audryanna Zurita NP-C    12/21/2019, 10:16 AM [place] [address] Office [phone] Fax [phone]  Notice: This dictation was prepared with Dragon dictation along with smaller phrase technology. Any transcriptional errors that result from this process are unintentional and may not be corrected upon review.

## 2019-12-19 ENCOUNTER — Ambulatory Visit (INDEPENDENT_AMBULATORY_CARE_PROVIDER_SITE_OTHER): Payer: 59 | Admitting: Family Medicine

## 2019-12-19 ENCOUNTER — Encounter (INDEPENDENT_AMBULATORY_CARE_PROVIDER_SITE_OTHER): Payer: Self-pay

## 2019-12-21 ENCOUNTER — Other Ambulatory Visit (INDEPENDENT_AMBULATORY_CARE_PROVIDER_SITE_OTHER): Payer: Self-pay | Admitting: Family Medicine

## 2019-12-21 ENCOUNTER — Ambulatory Visit: Payer: 59 | Admitting: General Practice

## 2019-12-21 ENCOUNTER — Other Ambulatory Visit: Payer: Self-pay

## 2019-12-21 ENCOUNTER — Encounter: Payer: Self-pay | Admitting: General Practice

## 2019-12-21 VITALS — BP 130/80 | HR 70 | Ht 66.0 in | Wt 228.4 lb

## 2019-12-21 DIAGNOSIS — E7800 Pure hypercholesterolemia, unspecified: Secondary | ICD-10-CM

## 2019-12-21 DIAGNOSIS — I10 Essential (primary) hypertension: Secondary | ICD-10-CM | POA: Diagnosis not present

## 2019-12-21 DIAGNOSIS — R7303 Prediabetes: Secondary | ICD-10-CM

## 2019-12-21 DIAGNOSIS — R0789 Other chest pain: Secondary | ICD-10-CM

## 2019-12-21 NOTE — Patient Instructions (Signed)
Medication Instructions:  The current medical regimen is effective;  continue present plan and medications as directed. Please refer to the Current Medication list given to you today. *If you need a refill on your cardiac medications before your next appointment, please call your pharmacy*  Special Instructions PLEASE READ AND FOLLOW SALTY 6-ATTACHED  PLEASE READ AND FOLLOW MINDFULNESS TIPS-ATTACHED  Follow-Up: Your next appointment:  6 month(s)   In Person with Skeet Latch, MD -[place], FNP-C  At [facility], you and your health needs are our priority.  As part of our continuing mission to provide you with exceptional heart care, we have created designated Provider Care Teams.  These Care Teams include your primary Cardiologist (physician) and Advanced Practice Providers (APPs -  Physician Assistants and Nurse Practitioners) who all work together to provide you with the care you need, when you need it.       Mindfulness-Based Stress Reduction Mindfulness-based stress reduction (MBSR) is a program that helps people learn to practice mindfulness. Mindfulness is the practice of intentionally paying attention to the present moment. It can be learned and practiced through techniques such as education, breathing exercises, meditation, and yoga. MBSR includes several mindfulness techniques in one program. MBSR works best when you understand the treatment, are willing to try new things, and can commit to spending time practicing what you learn. MBSR training may include learning about:  How your emotions, thoughts, and reactions affect your body.  New ways to respond to things that cause negative thoughts to start (triggers).  How to notice your thoughts and let go of them.  Practicing awareness of everyday things that you normally do without thinking.  The techniques and goals of different types of meditation. What are the benefits of MBSR? MBSR can have many benefits,  which include helping you to:  Develop self-awareness. This refers to knowing and understanding yourself.  Learn skills and attitudes that help you to participate in your own health care.  Learn new ways to care for yourself.  Be more accepting about how things are, and let things go.  Be less judgmental and approach things with an open mind.  Be patient with yourself and trust yourself more. MBSR has also been shown to:  Reduce negative emotions, such as depression and anxiety.  Improve memory and focus.  Change how you sense and approach pain.  Boost your body's ability to fight infections.  Help you connect better with other people.  Improve your sense of well-being. Follow these instructions at home:   Find a local in-person or online MBSR program.  Set aside some time regularly for mindfulness practice.  Find a mindfulness practice that works best for you. This may include one or more of the following: ? Meditation. Meditation involves focusing your mind on a certain thought or activity. ? Breathing awareness exercises. These help you to stay present by focusing on your breath. ? Body scan. For this practice, you lie down and pay attention to each part of your body from head to toe. You can identify tension and soreness and intentionally relax parts of your body. ? Yoga. Yoga involves stretching and breathing, and it can improve your ability to move and be flexible. It can also provide an experience of testing your body's limits, which can help you release stress. ? Mindful eating. This way of eating involves focusing on the taste, texture, color, and smell of each bite of food. Because this slows down eating and helps you feel  full sooner, it can be an important part of a weight-loss plan.  Find a podcast or recording that provides guidance for breathing awareness, body scan, or meditation exercises. You can listen to these any time when you have a free moment to rest  without distractions.  Follow your treatment plan as told by your health care provider. This may include taking regular medicines and making changes to your diet or lifestyle as recommended. How to practice mindfulness To do a basic awareness exercise:  Find a comfortable place to sit.  Pay attention to the present moment. Observe your thoughts, feelings, and surroundings just as they are.  Avoid placing judgment on yourself, your feelings, or your surroundings. Make note of any judgment that comes up, and let it go.  Your mind may wander, and that is okay. Make note of when your thoughts drift, and return your attention to the present moment. To do basic mindfulness meditation:  Find a comfortable place to sit. This may include a stable chair or a firm floor cushion. ? Sit upright with your back straight. Let your arms fall next to your side with your hands resting on your legs. ? If sitting in a chair, rest your feet flat on the floor. ? If sitting on a cushion, cross your legs in front of you.  Keep your head in a neutral position with your chin dropped slightly. Relax your jaw and rest the tip of your tongue on the roof of your mouth. Drop your gaze to the floor. You can close your eyes if you like.  Breathe normally and pay attention to your breath. Feel the air moving in and out of your nose. Feel your belly expanding and relaxing with each breath.  Your mind may wander, and that is okay. Make note of when your thoughts drift, and return your attention to your breath.  Avoid placing judgment on yourself, your feelings, or your surroundings. Make note of any judgment or feelings that come up, let them go, and bring your attention back to your breath.  When you are ready, lift your gaze or open your eyes. Pay attention to how your body feels after the meditation. Where to find more information You can find more information about MBSR from:  Your health care  provider.  Community-based meditation centers or programs.  Programs offered near you. Summary  Mindfulness-based stress reduction (MBSR) is a program that teaches you how to intentionally pay attention to the present moment. It is used with other treatments to help you cope better with daily stress, emotions, and pain.  MBSR focuses on developing self-awareness, which allows you to respond to life stress without judgment or negative emotions.  MBSR programs may involve learning different mindfulness practices, such as breathing exercises, meditation, yoga, body scan, or mindful eating. Find a mindfulness practice that works best for you, and set aside time for it on a regular basis. This information is not intended to replace advice given to you by your health care provider. Make sure you discuss any questions you have with your health care provider. Document Revised: 03/19/2017 Document Reviewed: 08/13/2016 Elsevier Patient Education  [place].

## 2019-12-21 NOTE — Telephone Encounter (Signed)
Mychart message sent to patient.

## 2019-12-26 NOTE — Progress Notes (Signed)
  Office: [phone]  /  Fax: [phone]    Date: January 04, 2020   Appointment Start Time: 8:31am Duration: 31 minutes Provider: Glennie Isle, Psy.D. Type of Session: Individual Therapy  Location of Patient: Home Location of Provider: Provider's Home Type of Contact: Telepsychological Visit via MyChart Video Visit  Session Content: Lori Luna is a 54 y.o. female presenting for a follow-up appointment to address the previously established treatment goal of increasing coping skills. Today's appointment was a telepsychological visit due to COVID-19. Lori Luna provided verbal consent for today's telepsychological appointment and she is aware she is responsible for securing confidentiality on her end of the session. Prior to proceeding with today's appointment, Lori Luna's physical location at the time of this appointment was obtained as well a phone number she could be reached at in the event of technical difficulties. Lori Luna and this provider participated in today's telepsychological service.   This provider conducted a brief check-in. Lori Luna reported she started again with her structured meal plan, noting she lost four pounds. She acknowledged having cravings in the evenings. Her eating habits were explored, and it was recommended she speak with her provider to clarify if she would benefit from following a structured meal plan and/or journaling. She agreed. Additionally, Lori Luna and this provider discussed the dieting mentality as well as all or nothing thinking. Lori Luna was receptive to today's appointment as evidenced by openness to sharing, responsiveness to feedback, and willingness to implement discussed strategies.  Mental Status Examination:  Appearance: well groomed and appropriate hygiene  Behavior: appropriate to circumstances Mood: euthymic Affect: mood congruent Speech: normal in rate, volume, and tone Eye Contact: appropriate Psychomotor Activity: appropriate Gait: unable to  assess Thought Process: linear, logical, and goal directed  Thought Content/Perception: no hallucinations, delusions, bizarre thinking or behavior reported or observed and no evidence of suicidal and homicidal ideation, plan, and intent Orientation: time, person, place, and purpose of appointment Memory/Concentration: memory, attention, language, and fund of knowledge intact  Insight/Judgment: fair   Interventions:  Conducted a brief chart review Provided empathic reflections and validation Employed supportive psychotherapy interventions to facilitate reduced distress and to improve coping skills with identified stressors Psychoeducation provided regarding all-or-nothing thinking  DSM-5 Diagnosis(es): 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 Lori Luna has demonstrated progress in her goal as evidenced by increased awareness of hunger patterns.   Plan: Due to Lori Luna being out of town, the next appointment will be scheduled in 2-3 weeks, which will be via [place] Visit. The next session will focus on working towards the established treatment goal.

## 2020-01-04 ENCOUNTER — Telehealth (INDEPENDENT_AMBULATORY_CARE_PROVIDER_SITE_OTHER): Payer: 59 | Admitting: Psychology

## 2020-01-04 DIAGNOSIS — F418 Other specified anxiety disorders: Secondary | ICD-10-CM | POA: Diagnosis not present

## 2020-01-10 ENCOUNTER — Ambulatory Visit (INDEPENDENT_AMBULATORY_CARE_PROVIDER_SITE_OTHER): Payer: Self-pay | Admitting: Family Medicine

## 2020-01-10 ENCOUNTER — Encounter (INDEPENDENT_AMBULATORY_CARE_PROVIDER_SITE_OTHER): Payer: Self-pay | Admitting: Family Medicine

## 2020-01-10 ENCOUNTER — Other Ambulatory Visit: Payer: Self-pay

## 2020-01-10 VITALS — BP 128/76 | HR 76 | Temp 97.8°F | Ht 65.0 in | Wt 222.0 lb

## 2020-01-10 DIAGNOSIS — E8881 Metabolic syndrome: Secondary | ICD-10-CM

## 2020-01-10 DIAGNOSIS — I10 Essential (primary) hypertension: Secondary | ICD-10-CM

## 2020-01-10 DIAGNOSIS — F3289 Other specified depressive episodes: Secondary | ICD-10-CM

## 2020-01-10 DIAGNOSIS — Z6837 Body mass index (BMI) 37.0-37.9, adult: Secondary | ICD-10-CM

## 2020-01-10 MED ORDER — METFORMIN HCL 500 MG PO TABS: 500.0000 mg | ORAL_TABLET | Freq: Every day | ORAL | 0 refills | Status: DC

## 2020-01-10 NOTE — Progress Notes (Unsigned)
Office: [phone]  /  Fax: [phone]    Date: January 24, 2020   Appointment Start Time: *** Duration: *** minutes Provider: Glennie Isle, Psy.D. Type of Session: Individual Therapy  Location of Patient: {gbptloc:23249} Location of Provider: Provider's Home Type of Contact: Telepsychological Visit via MyChart Video Visit  Session Content: This provider called Lori Luna at 3:32pm as she did not present for the telepsychological appointment. A HIPAA compliant voicemail requesting a call back could not be left as her mailbox was full. As such, today's appointment was initiated *** minutes late.  Lori Luna is a 54 y.o. female presenting for a follow-up appointment to address the previously established treatment goal of increasing coping skills. Today's appointment was a telepsychological visit due to COVID-19. Lori Luna provided verbal consent for today's telepsychological appointment and she is aware she is responsible for securing confidentiality on her end of the session. Prior to proceeding with today's appointment, Lori Luna's physical location at the time of this appointment was obtained as well a phone number she could be reached at in the event of technical difficulties. Lori Luna and this provider participated in today's telepsychological service.   This provider conducted a brief check-in and verbally administered the PHQ-9 and GAD-7. ***Psychoeducation regarding triggers for emotional eating was provided. Lori Luna was provided a handout, and encouraged to utilize the handout between now and the next appointment to increase awareness of triggers and frequency. Lori Luna agreed. This provider also discussed behavioral strategies for specific triggers, such as placing the utensil down when conversing to avoid mindless eating. Lori Luna provided verbal consent during today's appointment for this provider to send *** via e-mail. Notably, this provider discussed her upcoming maternity leave toward the end  of November. Lori Luna acknowledged understanding given the uncertain nature of the circumstances, this provider may be out of the office sooner. This provider and Lori Luna discussed referral options and verbal consent was provided for this provider to send a list of referral options via e-mail. All questions/concerns were addressed. Lori Luna denied any concerns. Overall, Lori Luna was receptive to today's appointment as evidenced by openness to sharing, responsiveness to feedback, and {gbreceptiveness:23401}.  Mental Status Examination:  Appearance: {Appearance:22431} Behavior: {Behavior:22445} Mood: {gbmood:21757} Affect: {Affect:22436} Speech: {Speech:22432} Eye Contact: {Eye Contact:22433} Psychomotor Activity: {Motor Activity:22434} Gait: {gbgait:23404} Thought Process: {thought process:22448}  Thought Content/Perception: {disturbances:22451} Orientation: {Orientation:22437} Memory/Concentration: {gbcognition:22449} Insight/Judgment: {Insight:22446}  Structured Assessments Results: The Patient Health Questionnaire-9 (PHQ-9) is a self-report measure that assesses symptoms and severity of depression over the course of the last two weeks. Thi obtained a score of *** suggesting {GBPHQ9SEVERITY:21752}. [name] finds the endorsed symptoms to be {gbphq9difficulty:21754}. [0= Not at all; 1= Several days; 2= More than half the days; 3= Nearly every day] Little interest or pleasure in doing things ***  Feeling down, depressed, or hopeless ***  Trouble falling or staying asleep, or sleeping too much ***  Feeling tired or having little energy ***  Poor appetite or overeating ***  Feeling bad about yourself --- or that you are a failure or have let yourself or your family down ***  Trouble concentrating on things, such as reading the newspaper or watching television ***  Moving or speaking so slowly that other people could have noticed? Or the opposite --- being so fidgety or restless that you have  been moving around a lot more than usual ***  Thoughts that you would be better off dead or hurting yourself in some way ***  PHQ-9 Score ***    The Generalized Anxiety Disorder-7 (GAD-7) is a  brief self-report measure that assesses symptoms of anxiety over the course of the last two weeks. [name] obtained a score of *** suggesting {gbgad7severity:21753}. Savaya finds the endorsed symptoms to be {gbphq9difficulty:21754}. 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 Lori Luna has demonstrated progress in her goal as evidenced by {gbtxprogress:22839}. Lori Luna also {gbtxprogress2:22951}.  Plan: The next appointment will be scheduled in {gbweeks:21758}, which will be {gbtxmodality:23402}. The next session will focus on {Plan for Next Appointment:23400}.

## 2020-01-11 ENCOUNTER — Encounter (INDEPENDENT_AMBULATORY_CARE_PROVIDER_SITE_OTHER): Payer: Self-pay | Admitting: Family Medicine

## 2020-01-11 NOTE — Progress Notes (Signed)
Chief Complaint:   OBESITY Lori Luna is here to discuss her progress with her obesity treatment plan along with follow-up of her obesity related diagnoses. Lori Luna is on the Category 3 Plan and states she is following her eating plan approximately 80% of the time. Lori Luna states she is doing 0 minutes 0 times per week.  Today's visit was #: 7 Starting weight: 230 lbs Starting date: 06/01/2019 Today's weight: 222 lbs Today's date: 01/10/2020 Total lbs lost to date: 8 Total lbs lost since last in-office visit: 1  Interim History: Lori Luna's last office visit was 11/20/2019 with Dr. Leafy Ro. She is down 1 lb today. Her hunger is not an issue when she is following the plan. She does report cravings for Coke and sweets.  Subjective:   1. Insulin resistance Desarae has a diagnosis of insulin resistance based on her elevated fasting insulin level >5. She denies polyphagia. She is on metformin currently.  Lab Results  Component Value Date   INSULIN 15.0 06/01/2019   Lab Results  Component Value Date   HGBA1C 5.5 05/04/2019   2. Essential hypertension Lori Luna's blood pressure is well controlled. She is compliant with her medication.   BP Readings from Last 3 Encounters:  01/10/20 128/76  12/21/19 130/80  11/20/19 (!) 140/91   Lab Results  Component Value Date   CREATININE 0.86 06/01/2019   CREATININE 0.79 05/04/2019   CREATININE 0.93 09/22/2018   3. Other depression with emotional eating Lori Luna notes carbohydrate cravings. She is on Lexapro and she feels her mood is stable. We discussed bupropion and she would like to discuss this with her primary care physician. She would like to possibly discontinue Lexapro before starting another medication..  Assessment/Plan:   1. Insulin resistance We will refill metformin for 90 days, with no refill. Lori Luna agreed to follow-up with [name] as directed to closely monitor her progress.  - metFORMIN (GLUCOPHAGE) 500 MG tablet; Take 1 tablet  (500 mg total) by mouth daily with breakfast.  Dispense: 90 tablet; Refill: 0  2. Essential hypertension Lori Luna will continue Benicar.  3. Other depression with emotional eating She will discuss bupropion with her primary care physician.   4. Class 2 severe obesity with serious comorbidity and body mass index (BMI) of 37.0 to 37.9 in adult, unspecified obesity type (HCC) Lori Luna is currently in the action stage of change. As such, her goal is to continue with weight loss efforts. She has agreed to the Category 3 Plan-100 calories or keeping a food journal and adhering to recommended goals of 1300-1400 calories and 90-100 grams of protein daily.   Exercise goals: No exercise has been prescribed at this time.  Behavioral modification strategies: decreasing simple carbohydrates.  Lori Luna has agreed to follow-up with our clinic in 3 weeks.  Objective:   Blood pressure 128/76, pulse 76, temperature 97.8 F (36.6 C), temperature source Oral, height 5\' 5"  (1.651 m), weight 222 lb (100.7 kg), last menstrual period 10/19/2018, SpO2 97 %. Body mass index is 36.94 kg/m.  General: Cooperative, alert, well developed, in no acute distress. HEENT: Conjunctivae and lids unremarkable. Cardiovascular: Regular rhythm.  Lungs: Normal work of breathing. Neurologic: No focal deficits.   Lab Results  Component Value Date   CREATININE 0.86 06/01/2019   BUN 14 06/01/2019   NA 141 06/01/2019   K 4.4 06/01/2019   CL 103 06/01/2019   CO2 24 06/01/2019   Lab Results  Component Value Date   ALT 34 (H) 06/01/2019   AST 19  06/01/2019   ALKPHOS 90 06/01/2019   BILITOT 0.3 06/01/2019   Lab Results  Component Value Date   HGBA1C 5.5 05/04/2019   Lab Results  Component Value Date   INSULIN 15.0 06/01/2019   Lab Results  Component Value Date   TSH 3.230 05/04/2019   No results found for: CHOL, HDL, LDLCALC, LDLDIRECT, TRIG, CHOLHDL Lab Results  Component Value Date   WBC 4.6 05/04/2019    HGB 14.5 05/04/2019   HCT 43.5 05/04/2019   MCV 87 05/04/2019   PLT 306 05/04/2019   No results found for: IRON, TIBC, FERRITIN  Attestation Statements:   Reviewed by clinician on day of visit: allergies, medications, problem list, medical history, surgical history, family history, social history, and previous encounter notes.   Wilhemena Durie, am acting as Location manager for [employer], FNP-C.  I have reviewed the above documentation for accuracy and completeness, and I agree with the above. -  Georgianne Fick, FNP

## 2020-01-24 ENCOUNTER — Telehealth (INDEPENDENT_AMBULATORY_CARE_PROVIDER_SITE_OTHER): Payer: Self-pay | Admitting: Psychology

## 2020-01-24 ENCOUNTER — Encounter (INDEPENDENT_AMBULATORY_CARE_PROVIDER_SITE_OTHER): Payer: Self-pay

## 2020-01-24 NOTE — Telephone Encounter (Signed)
  Office: [phone]  /  Fax: [phone]  Date of Call: January 24, 2020  Time of Call: 3:32pm Provider: Glennie Isle, PsyD  CONTENT:  This provider called Lori Luna to check-in as she did not present for today's MyChart Video Visit appointment at 3:30pm. A HIPAA compliant voicemail could not be left requesting a call back as her mailbox was full. Of note, this provider stayed on the MyChart Video Visit appointment for 5 minutes prior to signing off per the clinic's grace period policy.    PLAN: This provider will wait for [name] to call back. No further follow-up planned by this provider.

## 2020-01-31 ENCOUNTER — Other Ambulatory Visit: Payer: Self-pay

## 2020-01-31 ENCOUNTER — Encounter (INDEPENDENT_AMBULATORY_CARE_PROVIDER_SITE_OTHER): Payer: Self-pay | Admitting: Family Medicine

## 2020-01-31 ENCOUNTER — Telehealth (INDEPENDENT_AMBULATORY_CARE_PROVIDER_SITE_OTHER): Payer: Self-pay | Admitting: Family Medicine

## 2020-01-31 DIAGNOSIS — Z6836 Body mass index (BMI) 36.0-36.9, adult: Secondary | ICD-10-CM

## 2020-01-31 DIAGNOSIS — E7849 Other hyperlipidemia: Secondary | ICD-10-CM

## 2020-01-31 DIAGNOSIS — E559 Vitamin D deficiency, unspecified: Secondary | ICD-10-CM

## 2020-01-31 DIAGNOSIS — E8881 Metabolic syndrome: Secondary | ICD-10-CM

## 2020-02-06 ENCOUNTER — Ambulatory Visit (INDEPENDENT_AMBULATORY_CARE_PROVIDER_SITE_OTHER): Payer: 59 | Admitting: Family Medicine

## 2020-02-07 NOTE — Progress Notes (Signed)
Chart opened in error

## 2020-02-12 ENCOUNTER — Telehealth (INDEPENDENT_AMBULATORY_CARE_PROVIDER_SITE_OTHER): Payer: Self-pay | Admitting: Psychology

## 2020-02-12 NOTE — Telephone Encounter (Signed)
°  Office: [phone]  /  Fax: [phone]  Date of Call: February 12, 2020  Time of Call: 11:18am Duration of Call: ~ 3 minutes Provider: Glennie Isle, PsyD  CONTENT: This provider called Lori Luna to check-in and schedule a follow-up appointment. She shared she is dealing with "insurance issues" and also considering an alternate weight loss program her friends are following. Lori Luna to call back and schedule with the clinic once insurance issues have resolved and should she wish to continue with the clinic. She agreed. This provider also discussed her upcoming maternity leave toward the end of November. Lori Luna acknowledged understanding given the uncertain nature of the circumstances, this provider may be out of the office sooner. This provider and Lori Luna discussed referral options and verbal consent was provided for this provider to send a list of referral options via e-mail. All questions/concerns were addressed. Lori Luna denied any concerns. No evidence of suicidal and homicidal ideation, plan, or intent.  PLAN: This provider will e-mail referral options as discussed. No further follow-up planned by this provider.

## 2020-04-02 NOTE — Telephone Encounter (Signed)
Spoke with pt and this past Saturday while was out eating along with having a couple of drinks.Pt walked to [place] and was fine and when left restaurant walked by some gentlemen smoking cigars and felt like could not breath grabbed chest "thought maybe was asthma attack " episode lasted 10 min. Per friends pt grabbed chest and was hyperventilating and friends encouraged her to call Cardiologist Per pt yesterday and today throat feels tight Wil lforward to [name] NP for review and recommendations./cy

## 2020-04-15 ENCOUNTER — Encounter (INDEPENDENT_AMBULATORY_CARE_PROVIDER_SITE_OTHER): Payer: Self-pay

## 2020-05-13 ENCOUNTER — Encounter: Payer: Self-pay | Admitting: Cardiovascular Disease

## 2020-05-13 ENCOUNTER — Other Ambulatory Visit: Payer: Self-pay

## 2020-05-13 ENCOUNTER — Ambulatory Visit (INDEPENDENT_AMBULATORY_CARE_PROVIDER_SITE_OTHER): Payer: 59 | Admitting: Cardiovascular Disease

## 2020-05-13 VITALS — BP 138/94 | HR 70 | Ht 66.0 in | Wt 227.8 lb

## 2020-05-13 DIAGNOSIS — R0789 Other chest pain: Secondary | ICD-10-CM | POA: Diagnosis not present

## 2020-05-13 DIAGNOSIS — Z6837 Body mass index (BMI) 37.0-37.9, adult: Secondary | ICD-10-CM

## 2020-05-13 DIAGNOSIS — I10 Essential (primary) hypertension: Secondary | ICD-10-CM | POA: Diagnosis not present

## 2020-05-13 DIAGNOSIS — E7849 Other hyperlipidemia: Secondary | ICD-10-CM | POA: Diagnosis not present

## 2020-05-13 NOTE — Progress Notes (Signed)
Cardiology Office Note   Date:  05/13/2020   ID:  Lori Luna, Lori Luna 02-Jul-1965, MRN [id number]  PCP:  Cari Caraway, MD  Cardiologist:   Skeet Latch, MD   No chief complaint on file.   History of Present Illness: Lori Luna is a 55 y.o. female with hypertension and hyperlipidemia who presents for follow-up.  Lori Luna was initially seen 06/2016 for cardiovascular risk assessment.  She has a family history of CAD and had some episodes of atypical chest pain.  She was referred for coronary CT-a 07/2016 that revealed a coronary calcium score of 0.    Her blood pressure was elevated and enalapril was switched to to olmesartan 20 mg.  Prior to that change she had a cough that has since improved.    She was hospitalized for withdrawal from gabapentin.  Telmisartan was subsequently switched to telmisartan/HCTZ.  In February she had an episode of waking up with left chest and arm pain.  She described it as a burning pain that did not change with exertion, though she has been on it while unable to exert herself much lately due to an injury to her foot.  There is no associated shortness of breath and the pain was better with palpation of the chest and arm.  At her last appointment Lori Luna had chest pain that was thought to be due to cervical radiculopathy.  Her blood pressures have been running high at home but were controlled in the office.  She followed up with Lori Memos, NP on 12/2019 and was doing better.  Lori Luna had COVID-19 in early January.  She mostly had muscle aches, fatigue, and headache.  She had minimal respiratory symptoms.  She is concerned because a friend recently had Covid and then after recovering died suddenly in her sleep.  Lori Luna has felt well.  She has not had any chest pain.  She denies any lower extremity edema, orthopnea, or PND.  She had a colonoscopy and was told that she likely has sleep apnea.  She is awaiting a sleep study.  She had  an episode of profound shortness of breath when walking out of a restaurant in December.  There was heavy cigar smoke in the air.  She also had a difficult time catching her breath after laughing intensely with some friends.  She denies any exertional symptoms.  Lately she has not been getting any exercise.  Last year she was able to lose 18 pounds in 4 months.  She did this by participating in a healthy weight and wellness program.  However after that she started a new job that was very stressful.  She stopped going to healthy weight and wellness and has been stress eating.  She also injured her right knee after a fall in November.  She has been taking diclofenac.  She does not check her blood pressure as regularly anymore but when she does notes that it has been elevated.   Past Medical History:  Diagnosis Date  . Anxiety   . Atypical chest pain 06/19/2016  . Back pain   . Brain fog   . DDD (degenerative disc disease), lumbar   . Dyspnea   . Essential hypertension 06/19/2016  . Family history of degenerative disc disease   . Gallbladder problem   . Hot flashes   . Hyperlipidemia   . Hyperlipidemia 06/19/2016  . Hypertension   . Joint pain   . Lower extremity edema   . Menopause   .  Trouble in sleeping   . Vitamin D deficiency   . Weight gain     Past Surgical History:  Procedure Laterality Date  . GALLBLADDER SURGERY       Current Outpatient Medications  Medication Sig Dispense Refill  . Cholecalciferol (VITAMIN D) 50 MCG (2000 UT) CAPS Take 1 capsule by mouth daily.    . diclofenac (VOLTAREN) 75 MG EC tablet 1 tablet as needed    . ibuprofen (ADVIL) 800 MG tablet Take 800 mg by mouth 3 (three) times daily.    . Magnesium 400 MG TABS Take 1 tablet by mouth daily.    . metFORMIN (GLUCOPHAGE) 500 MG tablet Take 1 tablet (500 mg total) by mouth daily with breakfast. 90 tablet 0  . methocarbamol (ROBAXIN) 500 MG tablet As needed.    Marland Kitchen olmesartan-hydrochlorothiazide (BENICAR HCT)  40-12.5 MG tablet Take 1 tablet by mouth daily. 90 tablet 3  . traMADol (ULTRAM) 50 MG tablet Take 50 mg by mouth every 6 (six) hours as needed for pain.     No current facility-administered medications for this visit.    Allergies:   Amoxicillin-pot clavulanate    Social History:  The patient  reports that she has never smoked. She has never used smokeless tobacco. She reports current alcohol use of about 2.0 standard drinks of alcohol per week. She reports that she does not use drugs.   Family History:  The patient's family history includes Anxiety disorder in her mother; Autoimmune disease in her son; CAD in her father and maternal grandmother; Cancer in her father, paternal grandfather, and paternal grandmother; Diabetes in her maternal grandmother; Healthy in her brother, son, and son; Hyperlipidemia in her father; Hypertension in her father and mother; Psoriasis in her father and son; Stroke in her maternal aunt; Vitiligo in her son.    ROS:  Please see the history of present illness.   Otherwise, review of systems are positive for none.   All other systems are reviewed and negative.    PHYSICAL EXAM: VS:  BP (!) 138/94   Pulse 70   Ht 5\' 6"  (1.676 m)   Wt 227 lb 12.8 oz (103.3 kg)   LMP 10/19/2018 (Exact Date)   BMI 36.77 kg/m  , BMI Body mass index is 36.77 kg/m. GENERAL:  Well appearing HEENT: Pupils equal round and reactive, fundi not visualized, oral mucosa unremarkable NECK:  No jugular venous distention, waveform within normal limits, carotid upstroke brisk and symmetric, no bruits LUNGS:  Clear to auscultation bilaterally HEART:  RRR.  PMI not displaced or sustained,S1 and S2 within normal limits, no S3, no S4, no clicks, no rubs, no murmurs ABD:  Flat, positive bowel sounds normal in frequency in pitch, no bruits, no rebound, no guarding, no midline pulsatile mass, no hepatomegaly, no splenomegaly EXT:  2 plus pulses throughout, no edema, no cyanosis no clubbing.  L  foot Cam walker boot SKIN:  No rashes no nodules NEURO:  Cranial nerves II through XII grossly intact, motor grossly intact throughout PSYCH:  Cognitively intact, oriented to person place and time   EKG:  EKG is ordered today. The ekg ordered 06/19/16 demonstrates sinus rhythm rate 76 bpm.  08/19/2017: Sinus rhythm.  Rate 77 bpm. 05/13/2020: Sinus rhythm.  Rate 70 bpm.  Coronary CT-A 07/29/16: 1. Coronary calcium score of 0. This was 0 percentile for age and sex matched control.  2. Normal coronary origin with right dominance.  3. No evidence of CAD.  Recent Labs: 06/01/2019:  ALT 34; BUN 14; Creatinine, Ser 0.86; Magnesium 2.1; Potassium 4.4; Sodium 141    Lipid Panel No results found for: CHOL, TRIG, HDL, CHOLHDL, VLDL, LDLCALC, LDLDIRECT   06/08/16: Total cholesterol 220, triglycerides 175, HDL 56, LDL 129 Sodium 141, potassium 3.8, BUN 17, creatinine 0.1 AST 16, ALT 24  08/02/2018: Total cholesterol 210, triglycerides 166, HDL 49, LDL 129  Wt Readings from Last 3 Encounters:  05/13/20 227 lb 12.8 oz (103.3 kg)  01/10/20 222 lb (100.7 kg)  12/21/19 228 lb 6.4 oz (103.6 kg)      ASSESSMENT AND PLAN:  # Hypertension: Blood pressure is poorly controlled.  She understands that she needs to start back exercising and plans to go back to the healthy weight and wellness clinic.  We will refer her to the PREP program to the [facility].  Continue olmesartan/HCTZ for now.  If her blood pressure remains elevated in 3 months we will need to titrate the dose.  # Hyperlipidemia: ASCVD 10 year risk is 3.5%.  Diet and exercise as above.  No indication for statin at this time.  # Obestiy: Resume management by the healthy weight and wellness clinic.  Current medicines are reviewed at length with the patient today.  The patient does not have concerns regarding medicines.  The following changes have been made: Increase olmesartan to 40 mg.  Labs/ tests ordered today include:   No orders of the  defined types were placed in this encounter.    Disposition:   FU with Dorien Mayotte C. Oval Linsey, MD, [facility] in 3 months    Signed, Chrisangel Eskenazi C. Oval Linsey, MD, [facility]  05/13/2020 9:49 AM    [facility]

## 2020-05-13 NOTE — Patient Instructions (Addendum)
Medication Instructions:  Your physician recommends that you continue on your current medications as directed. Please refer to the Current Medication list given to you today.  *If you need a refill on your cardiac medications before your next appointment, please call your pharmacy*  Lab Work: NONE   Testing/Procedures: NONE   Follow-Up: At [facility], you and your health needs are our priority.  As part of our continuing mission to provide you with exceptional heart care, we have created designated Provider Care Teams.  These Care Teams include your primary Cardiologist (physician) and Advanced Practice Providers (APPs -  Physician Assistants and Nurse Practitioners) who all work together to provide you with the care you need, when you need it.  We recommend signing up for the patient portal called "MyChart".  Sign up information is provided on this After Visit Summary.  MyChart is used to connect with patients for Virtual Visits (Telemedicine).  Patients are able to view lab/test results, encounter notes, upcoming appointments, etc.  Non-urgent messages can be sent to your provider as well.   To learn more about what you can do with MyChart, go to [email or web address].    Your next appointment:   3 month(s)  The format for your next appointment:   In Person  Provider:   You may see Skeet Latch, MD or one of the following Advanced Practice Providers on your designated Care Team:    Kerin Ransom, PA-C  [place], [place]  [name], [place]  Other Instructions  TRY TO EXERCISE [address]   SOMEONE FROM THE PREP TEAM WILL BE IN TOUCH  IF YOU DO NOT HEAR FROM THEM IN 2 WEEKS CALL THE OFFICE TO FOLLOW UP

## 2020-05-14 ENCOUNTER — Telehealth: Payer: Self-pay

## 2020-05-14 NOTE — Telephone Encounter (Signed)
Call placed to pt reference PREP referral Not a good time to talk about program, husband is going into surgery Asked that I call back tomorrow Will put on list to call back tomorrow to discuss schedule needs and interest

## 2020-05-15 ENCOUNTER — Telehealth: Payer: Self-pay

## 2020-05-15 NOTE — Telephone Encounter (Signed)
Call plated to patient reference PREP referral.  Currently at work Requested another time to talk Available on 2/2 before 1pm Will retry her then

## 2020-05-16 ENCOUNTER — Telehealth: Payer: Self-pay | Admitting: *Deleted

## 2020-05-16 NOTE — Telephone Encounter (Signed)
Below [employer] message received and sent to Dr Oval Linsey, not in office today or tomorrow   Hi Dr Oval Linsey, I wanted to let you know my blood pressure has been creeping since I saw you Monday. I feel quite sure it is because of all of the stress related to my husbands emergency surgery on Tuesday. However while in the waiting room Tuesday I developed a severe headache which I assumed was HBP but I also started have some strange issues with vision that was worse on Wednesday. Luckily I saw my eye doctor this morning & diagnosed my right retina is swollen most likely from the blood pressure. My headaches have been persistent today & I have taken my BP 3 times today (once at eye doctor & twice myself) with readings of an average of 150/105. I called my pharmacist to ask if the Diclofenac (anti inflammatory) I've been on for 3 weeks could be causing the rise but he didn't think so. My gut is again that's is all the stress. I bring my husband home at 4 so more stress there. Can you recommend anything for me to do in the short term? Take another BP pill possibly? I have taken a couple of Tylenol to see if I can get it to ease off. Thank you.   Will forward to Pharm D for review

## 2020-05-17 MED ORDER — AMLODIPINE BESYLATE 5 MG PO TABS: 5.0000 mg | ORAL_TABLET | Freq: Every day | ORAL | 0 refills | Status: DC

## 2020-05-17 NOTE — Telephone Encounter (Signed)
Chronic use of diclofenac, ibuprofen or naproxen will cause raise in your blood pressure, please stop taking diclofenac and okay to use tylenol (acetaminophen) 1000mg  twice daily to manage pain until we can get your blood pressure under control.  I will add amlodipine 5mg  to your blood pressure regimen for now (take 1 tablet daily), decrease sodium in your diet as much as possible, avoid hi-intensity exercises, and increase hydration.  Please schedule HTN follow up with HTN clinic (pharmacist) or APP in 2 weeks

## 2020-05-17 NOTE — Telephone Encounter (Signed)
Left message to call back  

## 2020-05-17 NOTE — Telephone Encounter (Signed)
Follow up:   Patient returning a call back.  

## 2020-05-17 NOTE — Telephone Encounter (Signed)
Pt updated and verbalized understanding. Message also sent to schedulers to arrange 2 week f/u.

## 2020-05-21 NOTE — Telephone Encounter (Signed)
I am sorry to hear about her husband's surgery and her stress. I hope things calm down soon. Oral diclofenac could be contributing as well. For now let's increase amlodipine to 10mg . If her BP gets too low once the stress settles down then we can go back to 5mg .   TCR   This was already addressed by Pharm D in phone message 1/27

## 2020-05-27 NOTE — Progress Notes (Signed)
[place] Report   Patient Details  Name: Lori Luna MRN: [id number] Date of Birth: [birth year] Age: 55 y.o. PCP: Cari Caraway, MD  Vitals:   05/27/20 1502  BP: 112/78  Pulse: 89  SpO2: 98%  Weight: 230 lb (104.3 kg)  Height: 5' 5.5" (1.664 m)      [name] Eval - 05/27/20 1500      Referral    Referring Provider Oval Linsey    Reason for referral Hypertension;Inactivity;Obesitity/Overweight    Program Start Date 05/28/20   T/TH 1p-215pm     Measurement   Waist Circumference 43 inches    Hip Circumference 51 inches    Body fat 45 percent      Information for Trainer   Goals Reestablish exercise, Wt Loss goal 40 lbs    Current Exercise Yoga 1x a wk, walking dogs    Orthopedic Concerns right knee,DDD L4-L5, SI jt pt    Pertinent Medical History HTN, High triglycerides, no DM, ? OSA?    Current Barriers New job    Arts administrator   Medications that affect exercise Medication causing dizziness/drowsiness      Timed Up and Go (TUGS)   Timed Up and Go Low risk <9 seconds      Mobility and Daily Activities   I find it easy to walk up or down two or more flights of stairs. 2    I have no trouble taking out the trash. 4    I do housework such as vacuuming and dusting on my own without difficulty. 3    I can easily lift a gallon of milk (8lbs). 4    I can easily walk a mile. 3    I have no trouble reaching into high cupboards or reaching down to pick up something from the floor. 3    I do not have trouble doing out-door work such as Armed forces logistics/support/administrative officer, raking leaves, or gardening. 1      Mobility and Daily Activities   I feel younger than my age. 2    I feel independent. 4    I feel energetic. 2    I live an active life.  3    I feel strong. 3    I feel healthy. 2    I feel active as other people my age. 1      How fit and strong are you.   Fit and Strong Total Score 37          Past Medical History:   Diagnosis Date  . Anxiety   . Atypical chest pain 06/19/2016  . Back pain   . Brain fog   . DDD (degenerative disc disease), lumbar   . Dyspnea   . Essential hypertension 06/19/2016  . Family history of degenerative disc disease   . Gallbladder problem   . Hot flashes   . Hyperlipidemia   . Hyperlipidemia 06/19/2016  . Hypertension   . Joint pain   . Lower extremity edema   . Menopause   . Trouble in sleeping   . Vitamin D deficiency   . Weight gain    Past Surgical History:  Procedure Laterality Date  . GALLBLADDER SURGERY     Social History   Tobacco Use  Smoking Status Never Smoker  Smokeless Tobacco Never Used     Will bring in copy of diet plan she used previously that induced weight loss so I know how to encourage  and suggest   [name] 05/27/2020, 3:10 PM

## 2020-05-28 ENCOUNTER — Encounter (INDEPENDENT_AMBULATORY_CARE_PROVIDER_SITE_OTHER): Payer: Self-pay | Admitting: Family Medicine

## 2020-05-31 ENCOUNTER — Ambulatory Visit: Payer: 59

## 2020-06-04 NOTE — Progress Notes (Signed)
[facility] PREP Weekly Session   Patient Details  Name: Lori Luna MRN: [id number] Date of Birth: 14-Oct-1965 Age: 55 y.o. PCP: Cari Caraway, MD  Vitals:   06/04/20 1500  Weight: 224 lb 1.6 oz (101.7 kg)     [facility] Weekly seesion - 06/04/20 1500      Weekly Session   Topic Discussed Importance of resistance training;Other ways to be active    Minutes exercised this week 205 minutes    Classes attended to date 3          Weekly self reported progress notes reviewed.    [name] 06/04/2020, 3:01 PM

## 2020-06-08 ENCOUNTER — Other Ambulatory Visit: Payer: Self-pay | Admitting: Cardiovascular Disease

## 2020-06-11 NOTE — Progress Notes (Signed)
[facility] PREP Weekly Session   Patient Details  Name: Gracee Ratterree MRN: [id number] Date of Birth: 02-Aug-1965 Age: 55 y.o. PCP: Cari Caraway, MD  Vitals:   06/11/20 1433  Weight: 225 lb 11.2 oz (102.4 kg)     [facility] Weekly seesion - 06/11/20 1400      Weekly Session   Topic Discussed Healthy eating tips    Minutes exercised this week 250 minutes    Classes attended to date [place] 06/11/2020, 2:34 PM

## 2020-06-13 ENCOUNTER — Other Ambulatory Visit: Payer: Self-pay

## 2020-06-13 ENCOUNTER — Ambulatory Visit (INDEPENDENT_AMBULATORY_CARE_PROVIDER_SITE_OTHER): Payer: 59 | Admitting: Pharmacist Clinician (PhC)/ Clinical Pharmacy Specialist

## 2020-06-13 DIAGNOSIS — I10 Essential (primary) hypertension: Secondary | ICD-10-CM

## 2020-06-13 NOTE — Progress Notes (Signed)
06/13/2020 [place] 23-Apr-1965 [id number]   HPI:  Lori Luna is a 55 y.o. female patient of Dr Oval Linsey, with a [place] below who presents today for hypertension clinic evaluation.  In addition to hypertension, her medical history is significant for hyperlipidemia, obesity and insulin resistance.   When she saw Dr. Oval Linsey in January her pressure was elevated at 138/94.  No medication changes were made, but the patient instead was going to work on lifestyle modifications.   She was referred to the PREP exercise program and encouraged to go back to [facility].  Patient believed that much of her problem was due to stress and prior knee injury.    A few days after that visit she contacted the office with concerns about her pressure being up.  She was given amlodipine 5 mg and advised to discontinue diclofenac, as well as to see CVRR in 2 weeks for evaluation.  She is here today for evaluation.  States her stress level has dropped considerably since her husband is recovering from his surgeries and is mobile again.  She has gone back to work and also joined the [employer].  It was suggested the she discontinue using diclofenac, which she used twice daily until the end of January.  At this point she is taking 1 tablet about every other night, and using acetaminophen throughout the day.  She also notes that she recently completed a sleep study and her AHI was at 67.  She is to be fitted for CPAP in the near future.  Her hope is that by starting CPAP she will be able to stop taking the amlodipine for her BP.    Blood Pressure Goal:  130/80  Current Medications: olmesartan/hctz 40/12.5 mg qd, amlodipine 5 mg qd  Family Hx: father - multiple MI starting 19, PPM, stents, will be 68 in June; hypertension in mom , now 61;  Brother with hypertension; 3 children 25,22,20, oldest headed to hbp (son)  Social Hx: no tobacco, occasional alcohol  Diet: still  follows HWW dietary guidelines.  Believes her diet is low sodium, but admits to breads, cheeses and [place] sausage.  States sweets are her weakness.  Tries to eat 100 gm protein/day   Exercise: PREP 2 days per week   Home BP readings: none with her today states it was as high as 200/110 after husband surgery  Intolerances: ACEI - cough  Labs: 10/21:  Na 140, K 4.3, Glu 92, BUN 21, SCr 0.76  TC 240, TG 202, HDL 60, LDL 143   Wt Readings from Last 3 Encounters:  06/13/20 230 lb (104.3 kg)  06/11/20 225 lb 11.2 oz (102.4 kg)  06/04/20 224 lb 1.6 oz (101.7 kg)   BP Readings from Last 3 Encounters:  06/13/20 132/86  05/27/20 112/78  05/13/20 (!) 138/94   Pulse Readings from Last 3 Encounters:  06/13/20 76  05/27/20 89  05/13/20 70    Current Outpatient Medications  Medication Sig Dispense Refill  . amLODipine (NORVASC) 5 MG tablet TAKE 1 TABLET (5 MG TOTAL) BY MOUTH DAILY. 30 tablet 10  . Cholecalciferol (VITAMIN D) 50 MCG (2000 UT) CAPS Take 1 capsule by mouth daily.    Marland Kitchen ibuprofen (ADVIL) 800 MG tablet Take 800 mg by mouth 3 (three) times daily.    Marland Kitchen loratadine (CLARITIN) 10 MG tablet Take 10 mg by mouth daily.    . Magnesium 400 MG TABS Take 1 tablet by mouth daily.    Marland Kitchen  methocarbamol (ROBAXIN) 500 MG tablet As needed.    Marland Kitchen olmesartan-hydrochlorothiazide (BENICAR HCT) 40-12.5 MG tablet Take 1 tablet by mouth daily. 90 tablet 3  . traMADol (ULTRAM) 50 MG tablet Take 50 mg by mouth every 6 (six) hours as needed for pain.     No current facility-administered medications for this visit.    Allergies  Allergen Reactions  . Amoxicillin-Pot Clavulanate Diarrhea    Past Medical History:  Diagnosis Date  . Anxiety   . Atypical chest pain 06/19/2016  . Back pain   . Brain fog   . DDD (degenerative disc disease), lumbar   . Dyspnea   . Essential hypertension 06/19/2016  . Family history of degenerative disc disease   . Gallbladder problem   . Hot flashes   . Hyperlipidemia    . Hyperlipidemia 06/19/2016  . Hypertension   . Joint pain   . Lower extremity edema   . Menopause   . Trouble in sleeping   . Vitamin D deficiency   . Weight gain     Blood pressure 132/86, pulse 76, resp. rate 15, height 5\' 6"  (1.676 m), weight 230 lb (104.3 kg), last menstrual period 10/19/2018, SpO2 97 %.  Essential hypertension Patient with essential hypertension, doing better today though not quite to goal.  We are going to stress lifestyle modifications for the next month or two, with her goal of regular exercise (thru PREP), getting set up with CPAP, and watching her sodium intake.  We had a long discussion on hidden salt in the diet and since she records her food intake I asked that she count the sodium for 1-2 days to see how much she is really getting.  She will continue to work on the HW&W dietary plan, and I gave her the DASH diet information as well.  She is hopeful that she can come off the amlodipine, but I stressed that there is no guarantee that using CPAP will bring her BP down enough that he needs to come off any medications.  She is scheduled to see Dr. Oval Linsey in April.      Tommy Medal PharmD CPP [facility] [address] [place],  [address] [phone]

## 2020-06-13 NOTE — Patient Instructions (Signed)
Return for a a follow up appointment with Dr. Oval Linsey in April  Check your blood pressure at home daily and keep record of the readings.  Take your BP meds as follows:  Continue with your current medications  Bring all of your meds, your BP cuff and your record of home blood pressures to your next appointment.  Exercise as you're able, try to walk approximately 30 minutes per day.  Keep salt intake to a minimum, especially watch canned and prepared boxed foods.  Eat more fresh fruits and vegetables and fewer canned items.  Avoid eating in fast food restaurants.    HOW TO TAKE YOUR BLOOD PRESSURE: . Rest 5 minutes before taking your blood pressure. .  Don't smoke or drink caffeinated beverages for at least 30 minutes before. . Take your blood pressure before (not after) you eat. . Sit comfortably with your back supported and both feet on the floor (don't cross your legs). . Elevate your arm to heart level on a table or a desk. . Use the proper sized cuff. It should fit smoothly and snugly around your bare upper arm. There should be enough room to slip a fingertip under the cuff. The bottom edge of the cuff should be 1 inch above the crease of the elbow. . Ideally, take 3 measurements at one sitting and record the average.    DASH Eating Plan DASH stands for Dietary Approaches to Stop Hypertension. The DASH eating plan is a healthy eating plan that has been shown to:  Reduce high blood pressure (hypertension).  Reduce your risk for type 2 diabetes, heart disease, and stroke.  Help with weight loss. What are tips for following this plan? Reading food labels  Check food labels for the amount of salt (sodium) per serving. Choose foods with less than 5 percent of the Daily Value of sodium. Generally, foods with less than 300 milligrams (mg) of sodium per serving fit into this eating plan.  To find whole grains, look for the word "whole" as the first word in the ingredient  list. Shopping  Buy products labeled as "low-sodium" or "no salt added."  Buy fresh foods. Avoid canned foods and pre-made or frozen meals. Cooking  Avoid adding salt when cooking. Use salt-free seasonings or herbs instead of table salt or sea salt. Check with your health care provider or pharmacist before using salt substitutes.  Do not Selby Slovacek foods. Cook foods using healthy methods such as baking, boiling, grilling, roasting, and broiling instead.  Cook with heart-healthy oils, such as olive, canola, avocado, soybean, or sunflower oil. Meal planning  Eat a balanced diet that includes: ? 4 or more servings of fruits and 4 or more servings of vegetables each day. Try to fill one-half of your plate with fruits and vegetables. ? 6-8 servings of whole grains each day. ? Less than 6 oz (170 g) of lean meat, poultry, or fish each day. A 3-oz (85-g) serving of meat is about the same size as a deck of cards. One egg equals 1 oz (28 g). ? 2-3 servings of low-fat dairy each day. One serving is 1 cup (237 mL). ? 1 serving of nuts, seeds, or beans 5 times each week. ? 2-3 servings of heart-healthy fats. Healthy fats called omega-3 fatty acids are found in foods such as walnuts, flaxseeds, fortified milks, and eggs. These fats are also found in cold-water fish, such as sardines, salmon, and mackerel.  Limit how much you eat of: ? Canned or prepackaged  foods. ? Food that is high in trans fat, such as some fried foods. ? Food that is high in saturated fat, such as fatty meat. ? Desserts and other sweets, sugary drinks, and other foods with added sugar. ? Full-fat dairy products.  Do not salt foods before eating.  Do not eat more than 4 egg yolks a week.  Try to eat at least 2 vegetarian meals a week.  Eat more home-cooked food and less restaurant, buffet, and fast food.   Lifestyle  When eating at a restaurant, ask that your food be prepared with less salt or no salt, if possible.  If you  drink alcohol: ? Limit how much you use to:  0-1 drink a day for women who are not pregnant.  0-2 drinks a day for men. ? Be aware of how much alcohol is in your drink. In the [place] one drink equals one 12 oz bottle of beer (355 mL), one 5 oz glass of wine (148 mL), or one 1 oz glass of hard liquor (44 mL). General information  Avoid eating more than 2,300 mg of salt a day. If you have hypertension, you may need to reduce your sodium intake to 1,500 mg a day.  Work with your health care provider to maintain a healthy body weight or to lose weight. Ask what an ideal weight is for you.  Get at least 30 minutes of exercise that causes your heart to beat faster (aerobic exercise) most days of the week. Activities may include walking, swimming, or biking.  Work with your health care provider or dietitian to adjust your eating plan to your individual calorie needs. What foods should I eat? Fruits All fresh, dried, or frozen fruit. Canned fruit in natural juice (without added sugar). Vegetables Fresh or frozen vegetables (raw, steamed, roasted, or grilled). Low-sodium or reduced-sodium tomato and vegetable juice. Low-sodium or reduced-sodium tomato sauce and tomato paste. Low-sodium or reduced-sodium canned vegetables. Grains Whole-grain or whole-wheat bread. Whole-grain or whole-wheat pasta. Brown rice. [place]. Bulgur. Whole-grain and low-sodium cereals. Pita bread. Low-fat, low-sodium crackers. Whole-wheat flour tortillas. Meats and other proteins Skinless chicken or [place]. Ground chicken or [place]. Pork with fat trimmed off. Fish and seafood. Egg whites. Dried beans, peas, or lentils. Unsalted nuts, nut butters, and seeds. Unsalted canned beans. Lean cuts of beef with fat trimmed off. Low-sodium, lean precooked or cured meat, such as sausages or meat loaves. Dairy Low-fat (1%) or fat-free (skim) milk. Reduced-fat, low-fat, or fat-free cheeses. Nonfat, low-sodium ricotta or cottage  cheese. Low-fat or nonfat yogurt. Low-fat, low-sodium cheese. Fats and oils Soft margarine without trans fats. Vegetable oil. Reduced-fat, low-fat, or light mayonnaise and salad dressings (reduced-sodium). Canola, safflower, olive, avocado, soybean, and sunflower oils. Avocado. Seasonings and condiments Herbs. Spices. Seasoning mixes without salt. Other foods Unsalted popcorn and pretzels. Fat-free sweets. The items listed above may not be a complete list of foods and beverages you can eat. Contact a dietitian for more information. What foods should I avoid? Fruits Canned fruit in a light or heavy syrup. Fried fruit. Fruit in cream or butter sauce. Vegetables Creamed or fried vegetables. Vegetables in a cheese sauce. Regular canned vegetables (not low-sodium or reduced-sodium). Regular canned tomato sauce and paste (not low-sodium or reduced-sodium). Regular tomato and vegetable juice (not low-sodium or reduced-sodium). Angie Fava. Olives. Grains Baked goods made with fat, such as croissants, muffins, or some breads. Dry pasta or rice meal packs. Meats and other proteins Fatty cuts of meat. Ribs. Fried meat.  [place], salami, and other precooked or cured meats, such as sausages or meat loaves. Fat from the back of a pig (fatback). Bratwurst. Salted nuts and seeds. Canned beans with added salt. Canned or smoked fish. Whole eggs or egg yolks. Chicken or [place] with skin. Dairy Whole or 2% milk, cream, and half-and-half. Whole or full-fat cream cheese. Whole-fat or sweetened yogurt. Full-fat cheese. Nondairy creamers. Whipped toppings. Processed cheese and cheese spreads. Fats and oils Butter. Stick margarine. Lard. Shortening. Ghee. Bacon fat. Tropical oils, such as coconut, palm kernel, or palm oil. Seasonings and condiments Onion salt, garlic salt, seasoned salt, table salt, and sea salt. Worcestershire sauce. Tartar sauce. Barbecue sauce. Teriyaki sauce. Soy sauce, including reduced-sodium.  Steak sauce. Canned and packaged gravies. Fish sauce. Oyster sauce. Cocktail sauce. Store-bought horseradish. Ketchup. Mustard. Meat flavorings and tenderizers. Bouillon cubes. Hot sauces. Pre-made or packaged marinades. Pre-made or packaged taco seasonings. Relishes. Regular salad dressings. Other foods Salted popcorn and pretzels. The items listed above may not be a complete list of foods and beverages you should avoid. Contact a dietitian for more information. Where to find more information  National Heart, Lung, and Blood Institute: https://wilson-eaton.com/  American Heart Association: www.heart.org  Academy of Nutrition and Dietetics: www.eatright.[place]: www.kidney.org Summary  The DASH eating plan is a healthy eating plan that has been shown to reduce high blood pressure (hypertension). It may also reduce your risk for type 2 diabetes, heart disease, and stroke.  When on the DASH eating plan, aim to eat more fresh fruits and vegetables, whole grains, lean proteins, low-fat dairy, and heart-healthy fats.  With the DASH eating plan, you should limit salt (sodium) intake to 2,300 mg a day. If you have hypertension, you may need to reduce your sodium intake to 1,500 mg a day.  Work with your health care provider or dietitian to adjust your eating plan to your individual calorie needs. This information is not intended to replace advice given to you by your health care provider. Make sure you discuss any questions you have with your health care provider. Document Revised: 03/10/2019 Document Reviewed: 03/10/2019 Elsevier Patient Education  2021 [employer].

## 2020-06-13 NOTE — Assessment & Plan Note (Signed)
Patient with essential hypertension, doing better today though not quite to goal.  We are going to stress lifestyle modifications for the next month or two, with her goal of regular exercise (thru [facility]), getting set up with CPAP, and watching her sodium intake.  We had a long discussion on hidden salt in the diet and since she records her food intake I asked that she count the sodium for 1-2 days to see how much she is really getting.  She will continue to work on the HW&W dietary plan, and I gave her the DASH diet information as well.  She is hopeful that she can come off the amlodipine, but I stressed that there is no guarantee that using CPAP will bring her BP down enough that he needs to come off any medications.  She is scheduled to see Dr. Oval Linsey in April.

## 2020-06-18 NOTE — Progress Notes (Signed)
[facility] Weekly Session   Patient Details  Name: Lori Luna MRN: [id number] Date of Birth: Sep 05, 1965 Age: 55 y.o. PCP: Cari Caraway, MD  Vitals:   06/18/20 1444  Weight: 225 lb 1.6 oz (102.1 kg)     [facility] Weekly seesion - 06/18/20 1400      Weekly Session   Topic Discussed Health habits;Water    Minutes exercised this week 110 minutes    Classes attended to date [place] 06/18/2020, 2:45 PM

## 2020-06-25 NOTE — Progress Notes (Signed)
[facility] PRE[facility] Weekly Session   Patient Details  Name: Lori Luna MRN: [id number] Date of Birth: 06-10-1965 Age: 55 y.o. PCP: Cari Caraway, MD  Vitals:   06/25/20 1300  Weight: 223 lb 11.2 oz (101.5 kg)     [facility] Weekly seesion - 06/25/20 1400      Weekly Session   Topic Discussed Restaurant Eating    Minutes exercised this week 240 minutes    Classes attended to date [place] 06/25/2020, 2:43 PM

## 2020-07-09 NOTE — Progress Notes (Signed)
[facility] PRE[facility] Weekly Session   Patient Details  Name: Lori Luna MRN: [id number] Date of Birth: [birth year] Age: 55 y.o. PCP: Cari Caraway, MD  Vitals:   07/09/20 1330  Weight: 222 lb 4.8 oz (100.8 kg)     [facility] Weekly seesion - 07/09/20 1400      Weekly Session   Topic Discussed Expectations and non-scale victories    Minutes exercised this week 250 minutes    Classes attended to date [place] 07/09/2020, 2:57 PM

## 2020-07-16 ENCOUNTER — Ambulatory Visit: Payer: 59

## 2020-07-16 NOTE — Progress Notes (Signed)
[facility] PREP Weekly Session   Patient Details  Name: Lori Luna MRN: [id number] Date of Birth: 1965-09-15 Age: 55 y.o. PCP: Cari Caraway, MD  Vitals:   07/16/20 1418  Weight: 221 lb 11.2 oz (100.6 kg)     [facility] Weekly seesion - 07/16/20 1400      Weekly Session   Topic Discussed Other   Portion control   Minutes exercised this week 270 minutes    Classes attended to date 12            [place] 07/16/2020, 2:19 PM

## 2020-07-23 NOTE — Progress Notes (Signed)
[facility] PREP Weekly Session   Patient Details  Name: Lori Luna MRN: [id number] Date of Birth: 10/27/1965 Age: 55 y.o. PCP: Cari Caraway, MD  Vitals:   07/23/20 1300  Weight: 223 lb 3.2 oz (101.2 kg)     [facility] Weekly seesion - 07/23/20 1400      Weekly Session   Topic Discussed Finding support    Minutes exercised this week 220 minutes    Classes attended to date [place] 07/23/2020, 2:41 PM

## 2020-07-30 NOTE — Progress Notes (Signed)
[facility] PRE[facility] Weekly Session   Patient Details  Name: Lori Luna MRN: [id number] Date of Birth: April 14, 1966 Age: 55 y.o. PCP: Cari Caraway, MD  Vitals:   07/30/20 1505  Weight: 220 lb 1.6 oz (99.8 kg)     [facility] Weekly seesion - 07/30/20 1500      Weekly Session   Topic Discussed Calorie breakdown    Minutes exercised this week 230 minutes    Classes attended to date 14            [place] 07/30/2020, 3:06 PM

## 2020-08-13 ENCOUNTER — Ambulatory Visit: Payer: 59 | Admitting: Cardiovascular Disease

## 2020-08-13 ENCOUNTER — Encounter: Payer: Self-pay | Admitting: Cardiovascular Disease

## 2020-08-13 ENCOUNTER — Other Ambulatory Visit: Payer: Self-pay

## 2020-08-13 VITALS — BP 110/70 | HR 82 | Ht 66.0 in | Wt 222.0 lb

## 2020-08-13 DIAGNOSIS — Z6837 Body mass index (BMI) 37.0-37.9, adult: Secondary | ICD-10-CM

## 2020-08-13 DIAGNOSIS — G4733 Obstructive sleep apnea (adult) (pediatric): Secondary | ICD-10-CM | POA: Diagnosis not present

## 2020-08-13 DIAGNOSIS — E7849 Other hyperlipidemia: Secondary | ICD-10-CM | POA: Diagnosis not present

## 2020-08-13 DIAGNOSIS — I10 Essential (primary) hypertension: Secondary | ICD-10-CM | POA: Diagnosis not present

## 2020-08-13 HISTORY — DX: Obstructive sleep apnea (adult) (pediatric): G47.33

## 2020-08-13 NOTE — Patient Instructions (Signed)
Medication Instructions:  Continue same medications *If you need a refill on your cardiac medications before your next appointment, please call your pharmacy*   Lab Work: None ordered   Testing/Procedures: None ordered   Follow-Up: At [facility], you and your health needs are our priority.  As part of our continuing mission to provide you with exceptional heart care, we have created designated Provider Care Teams.  These Care Teams include your primary Cardiologist (physician) and Advanced Practice Providers (APPs -  Physician Assistants and Nurse Practitioners) who all work together to provide you with the care you need, when you need it.  We recommend signing up for the patient portal called "MyChart".  Sign up information is provided on this After Visit Summary.  MyChart is used to connect with patients for Virtual Visits (Telemedicine).  Patients are able to view lab/test results, encounter notes, upcoming appointments, etc.  Non-urgent messages can be sent to your provider as well.   To learn more about what you can do with MyChart, go to [email or web address].    Your next appointment:  1 year   Call in Jan to schedule April appointment    The format for your next appointment:  Office   Provider:  Dr.Bancroft

## 2020-08-13 NOTE — Assessment & Plan Note (Addendum)
She was congratulated on her diet and weight loss efforts.  Continue exercise.  She meets with [facility] progrma day to discuss continuity after completing the program.  Continue working with the healthy weight and wellness program

## 2020-08-13 NOTE — Assessment & Plan Note (Signed)
Blood pressure much better-controlled.  Continue amlodipine, olmesartan, and HCTZ.  She continues to exercise and lose weight her blood pressure might continue to trend down.  Did advise her that it would be okay to reduce the amlodipine to 2.5 mg if she starts to have low blood pressures or if she feels lightheaded or dizzy.  If she does so she will need to track and make sure her blood pressures remain less than 130/80.

## 2020-08-13 NOTE — Progress Notes (Signed)
[facility] PREP Weekly Session   Patient Details  Name: Lori Luna MRN: [id number] Date of Birth: Jul 05, 1965 Age: 55 y.o. PCP: Cari Caraway, MD  Vitals:   08/13/20 1434  Weight: 220 lb 3.2 oz (99.9 kg)     [facility] Weekly seesion - 08/13/20 1400      Weekly Session   Topic Discussed --   final fit test and surveys   Minutes exercised this week 210 minutes    Classes attended to date 18            [place] 08/13/2020, 2:36 PM

## 2020-08-13 NOTE — Assessment & Plan Note (Signed)
She is going to see her PCP today.  She would like to work on diet and exercise repeating her hemoglobin A1c.

## 2020-08-13 NOTE — Progress Notes (Signed)
Cardiology Office Note :   Date:  08/13/2020   ID:  Lori Luna, DOB 1965/09/19, MRN [id number]  PCP:  Lori Caraway, MD  Cardiologist:   Lori Latch, MD   No chief complaint on file.   History of Present Illness: Lori Luna is a 55 y.o. female with hypertension and hyperlipidemia who presents for follow-up.  Lori Luna was initially seen 06/2016 for cardiovascular risk assessment.  She has a family history of CAD and had some episodes of atypical chest pain.  She was referred for coronary CT-a 07/2016 that revealed a coronary calcium score of 0.    Her blood pressure was elevated and enalapril was switched to to olmesartan 20 mg.  Prior to that change she had a cough that has since improved.    She was hospitalized for withdrawal from gabapentin.  Telmisartan was subsequently switched to telmisartan/HCTZ.  In February she had an episode of waking up with left chest and arm pain.  She described it as a burning pain that did not change with exertion, though she has been on it while unable to exert herself much lately due to an injury to her foot.  There is no associated shortness of breath and the pain was better with palpation of the chest and arm.  Lori Luna had chest pain that was thought to be due to cervical radiculopathy.  Her blood pressures were running high at home but were controlled in the office.  She followed up with [name], NP on 12/2019 and was doing better.  Lori Luna had COVID-19 in early January 2021. At her last appointment, her blood pressure was above goal but she wanted to work on diet and exercise. Her pharmacist added amlodipine and she participated in [place].  Today, she is feeling good. She finished her program at [facility] last Thursday. She enjoys some weight training, but mainly exercises with the elliptical. There are no exertional chest pains or shortness of breath. Recently, she was diagnosed with sleep apnea, which she states  is affecting her weight loss because she is not sleeping well. Her blood pressure at home is usually around 120/70 in the morning and 130/80 in the afternoon. She does not report any LE edema.  She is continuing her weight loss plan with Lori Luna and watching her diet. Her son participates in softball which helps her stay active. Her next weight goal is to lose another 10 pounds and then have bloodwork done to help her stay motivated.  She denies any palpitations, lightheadedness/dizziness, orthopnea or PND.   Past Medical History:  Diagnosis Date  . Anxiety   . Atypical chest pain 06/19/2016  . Back pain   . Brain fog   . DDD (degenerative disc disease), lumbar   . Dyspnea   . Essential hypertension 06/19/2016  . Family history of degenerative disc disease   . Gallbladder problem   . Hot flashes   . Hyperlipidemia   . Hyperlipidemia 06/19/2016  . Hypertension   . Joint pain   . Lower extremity edema   . Menopause   . OSA (obstructive sleep apnea) 08/13/2020  . Trouble in sleeping   . Vitamin D deficiency   . Weight gain     Past Surgical History:  Procedure Laterality Date  . GALLBLADDER SURGERY       Current Outpatient Medications  Medication Sig Dispense Refill  . amLODipine (NORVASC) 5 MG tablet TAKE 1 TABLET (5 MG TOTAL) BY MOUTH DAILY.  30 tablet 10  . Cholecalciferol (VITAMIN D) 50 MCG (2000 UT) CAPS Take 1 capsule by mouth daily.    Marland Kitchen ibuprofen (ADVIL) 800 MG tablet Take 800 mg by mouth 3 (three) times daily.    Marland Kitchen loratadine (CLARITIN) 10 MG tablet Take 10 mg by mouth daily.    . Magnesium 400 MG TABS Take 1 tablet by mouth daily.    . methocarbamol (ROBAXIN) 500 MG tablet As needed.    Marland Kitchen olmesartan-hydrochlorothiazide (BENICAR HCT) 40-12.5 MG tablet Take 1 tablet by mouth daily. 90 tablet 3  . traMADol (ULTRAM) 50 MG tablet Take 50 mg by mouth every 6 (six) hours as needed for pain.     No current facility-administered medications for this visit.    Allergies:    Amoxicillin-pot clavulanate    Social History:  The patient  reports that she has never smoked. She has never used smokeless tobacco. She reports current alcohol use of about 2.0 standard drinks of alcohol per week. She reports that she does not use drugs.   Family History:  The patient's family history includes Anxiety disorder in her mother; Autoimmune disease in her son; CAD in her father and maternal grandmother; Cancer in her father, paternal grandfather, and paternal grandmother; Diabetes in her maternal grandmother; Healthy in her brother, son, and son; Hyperlipidemia in her father; Hypertension in her father and mother; Psoriasis in her father and son; Stroke in her maternal aunt; Vitiligo in her son.    ROS:  Please see the history of present illness.   Otherwise, review of systems are positive for none.    All other systems are reviewed and negative.    PHYSICAL EXAM: VS:  BP 110/70 (BP Location: Left Arm, Patient Position: Sitting, Cuff Size: Normal)   Pulse 82   Ht 5\' 6"  (1.676 m)   Wt 222 lb (100.7 kg)   LMP 10/19/2018 (Exact Date)   SpO2 98%   BMI 35.83 kg/m  , BMI Body mass index is 35.83 kg/m. GENERAL:  Well appearing HEENT: Pupils equal round and reactive, fundi not visualized, oral mucosa unremarkable NECK:  No jugular venous distention, waveform within normal limits, carotid upstroke brisk and symmetric, no bruits LUNGS:  Clear to auscultation bilaterally HEART:  RRR.  PMI not displaced or sustained,S1 and S2 within normal limits, no S3, no S4, no clicks, no rubs, no murmurs ABD:  Flat, positive bowel sounds normal in frequency in pitch, no bruits, no rebound, no guarding, no midline pulsatile mass, no hepatomegaly, no splenomegaly EXT:  2 plus pulses throughout, no edema, no cyanosis no clubbing.  L foot Cam walker boot SKIN:  No rashes no nodules NEURO:  Cranial nerves II through XII grossly intact, motor grossly intact throughout PSYCH:  Cognitively intact,  oriented to person place and time   EKG:   The ekg ordered 06/19/16 demonstrates sinus rhythm rate 76 bpm.  08/19/2017: Sinus rhythm.  Rate 77 bpm. 05/13/2020: Sinus rhythm.  Rate 70 bpm. 08/13/2020: EKG is not ordered today.  Coronary CT-A 07/29/16: 1. Coronary calcium score of 0. This was 0 percentile for age and sex matched control.  2. Normal coronary origin with right dominance.  3. No evidence of CAD.  Recent Labs: No results found for requested labs within last 8760 hours.    Lipid Panel No results found for: CHOL, TRIG, HDL, CHOLHDL, VLDL, LDLCALC, LDLDIRECT   06/08/16: Total cholesterol 220, triglycerides 175, HDL 56, LDL 129 Sodium 141, potassium 3.8, BUN 17, creatinine 0.1  AST 16, ALT 24  08/02/2018: Total cholesterol 210, triglycerides 166, HDL 49, LDL 129  Wt Readings from Last 3 Encounters:  08/13/20 222 lb (100.7 kg)  07/30/20 220 lb 1.6 oz (99.8 kg)  07/23/20 223 lb 3.2 oz (101.2 kg)      ASSESSMENT AND PLAN: Essential hypertension Blood pressure much better-controlled.  Continue amlodipine, olmesartan, and HCTZ.  She continues to exercise and lose weight her blood pressure might continue to trend down.  Did advise her that it would be okay to reduce the amlodipine to 2.5 mg if she starts to have low blood pressures or if she feels lightheaded or dizzy.  If she does so she will need to track and make sure her blood pressures remain less than 130/80.  OSA (obstructive sleep apnea) Newly diagnosed.  Awaiting CPAP machine.  Class 2 severe obesity with serious comorbidity and body mass index (BMI) of 37.0 to 37.9 in adult [facility]) She was congratulated on her diet and weight loss efforts.  Continue exercise.  She meets with [facility] progrma day to discuss continuity after completing the program.  Continue working with the healthy weight and wellness program  Hyperlipidemia She is going to see her PCP today.  She would like to work on diet and exercise repeating her  hemoglobin A1c.   Current medicines are reviewed at length with the patient today.  The patient does not have concerns regarding medicines.  The following changes have been made: Increase olmesartan to 40 mg.  Labs/ tests ordered today include:   No orders of the defined types were placed in this encounter.    Disposition:   FU with Alayah Knouff C. Oval Linsey, MD, [facility] in 1 year.   I,Mathew Stumpf,acting as a Education administrator for Lori Latch, MD.,have documented all relevant documentation on the behalf of Lori Latch, MD,as directed by  Lori Latch, MD while in the presence of Lori Latch, MD.    Signed, Marianne. Oval Linsey, MD, [facility]  08/13/2020 10:17 AM    [facility]

## 2020-08-13 NOTE — Assessment & Plan Note (Signed)
Newly diagnosed.  Awaiting CPAP machine.

## 2020-08-14 ENCOUNTER — Other Ambulatory Visit: Payer: Self-pay | Admitting: Obstetrics & Gynecology

## 2020-08-14 DIAGNOSIS — Z1231 Encounter for screening mammogram for malignant neoplasm of breast: Secondary | ICD-10-CM

## 2020-08-15 NOTE — Progress Notes (Signed)
[facility] Report   Patient Details  Name: Lori Luna MRN: [id number] Date of Birth: 27-Jan-1966 Age: 55 y.o. PCP: Cari Caraway, MD  Vitals:   08/15/20 1330  BP: 128/78  Pulse: 81  SpO2: 98%  Weight: 220 lb 12.8 oz (100.2 kg)  Height: 5' 5.5" (1.664 m)      [employer] Eval - 08/15/20 1500      Measurement   Waist Circumference 43 inches    Hip Circumference 50 inches    Body fat 45 percent      Information for Trainer   Goals --   Lose 15 pounds by July 9th wedding     Mobility and Daily Activities   I find it easy to walk up or down two or more flights of stairs. 3    I have no trouble taking out the trash. 4    I do housework such as vacuuming and dusting on my own without difficulty. 3    I can easily lift a gallon of milk (8lbs). 4    I can easily walk a mile. 3    I have no trouble reaching into high cupboards or reaching down to pick up something from the floor. 3    I do not have trouble doing out-door work such as Armed forces logistics/support/administrative officer, raking leaves, or gardening. 2      Mobility and Daily Activities   I feel younger than my age. 2    I feel independent. 4    I feel energetic. 3    I live an active life.  3    I feel strong. 3    I feel healthy. 2    I feel active as other people my age. 2      How fit and strong are you.   Fit and Strong Total Score 41          Past Medical History:  Diagnosis Date  . Anxiety   . Atypical chest pain 06/19/2016  . Back pain   . Brain fog   . DDD (degenerative disc disease), lumbar   . Dyspnea   . Essential hypertension 06/19/2016  . Family history of degenerative disc disease   . Gallbladder problem   . Hot flashes   . Hyperlipidemia   . Hyperlipidemia 06/19/2016  . Hypertension   . Joint pain   . Lower extremity edema   . Menopause   . OSA (obstructive sleep apnea) 08/13/2020  . Trouble in sleeping   . Vitamin D deficiency   . Weight gain    Past Surgical History:  Procedure  Laterality Date  . GALLBLADDER SURGERY     Social History   Tobacco Use  Smoking Status Never Smoker  Smokeless Tobacco Never Used         Camden Mazzaferro B Regenia Erck 08/15/2020, 3:22 PM

## 2020-08-19 ENCOUNTER — Ambulatory Visit
Admission: RE | Admit: 2020-08-19 | Discharge: 2020-08-19 | Disposition: A | Discharge status: Home / Self Care | Payer: 59 | Source: Ambulatory Visit | Attending: Obstetrics & Gynecology | Admitting: Obstetrics & Gynecology

## 2020-08-19 ENCOUNTER — Other Ambulatory Visit: Payer: Self-pay

## 2020-08-19 DIAGNOSIS — Z1231 Encounter for screening mammogram for malignant neoplasm of breast: Secondary | ICD-10-CM

## 2020-08-26 ENCOUNTER — Encounter (HOSPITAL_BASED_OUTPATIENT_CLINIC_OR_DEPARTMENT_OTHER): Payer: Self-pay

## 2020-08-26 NOTE — Telephone Encounter (Signed)
Please advise. tbw

## 2020-08-28 ENCOUNTER — Telehealth (HOSPITAL_BASED_OUTPATIENT_CLINIC_OR_DEPARTMENT_OTHER): Payer: Self-pay | Admitting: *Deleted

## 2020-08-28 NOTE — Telephone Encounter (Signed)
LMOVM for pt to return my call.  

## 2020-08-29 ENCOUNTER — Ambulatory Visit (INDEPENDENT_AMBULATORY_CARE_PROVIDER_SITE_OTHER): Payer: 59 | Admitting: Obstetrics & Gynecology

## 2020-08-29 ENCOUNTER — Encounter (HOSPITAL_BASED_OUTPATIENT_CLINIC_OR_DEPARTMENT_OTHER): Payer: Self-pay | Admitting: Obstetrics & Gynecology

## 2020-08-29 ENCOUNTER — Other Ambulatory Visit (HOSPITAL_COMMUNITY)
Admission: RE | Admit: 2020-08-29 | Discharge: 2020-08-29 | Disposition: A | Discharge status: Home / Self Care | Payer: 59 | Source: Ambulatory Visit | Attending: Obstetrics & Gynecology | Admitting: Obstetrics & Gynecology

## 2020-08-29 ENCOUNTER — Other Ambulatory Visit: Payer: Self-pay

## 2020-08-29 VITALS — BP 125/77 | HR 74 | Wt 222.0 lb

## 2020-08-29 DIAGNOSIS — N950 Postmenopausal bleeding: Secondary | ICD-10-CM

## 2020-08-29 NOTE — Progress Notes (Signed)
GYNECOLOGY  VISIT  CC:   Postmenopausal bleedign  HPI: 55 y.o. G3P0 Married White or Caucasian female here for endometrial biopsy for PMP bleeding.  Last cycle was 10/2018.  She is not on HRT.  We discussed on the phone evaluation with biopsy and/or ultrasound.  Will begin evaluation with biopsy first.  Pt had additional questions today that were addressed.    GYNECOLOGIC HISTORY: Patient's last menstrual period was 10/19/2018 (exact date). Contraception: PMP  Patient Active Problem List   Diagnosis Date Noted  . OSA (obstructive sleep apnea) 08/13/2020  . Insulin resistance 08/28/2019  . Class 2 severe obesity with serious comorbidity and body mass index (BMI) of 37.0 to 37.9 in adult ([employer]) 07/03/2019  . Essential hypertension 06/19/2016  . Hyperlipidemia 06/19/2016    Past Medical History:  Diagnosis Date  . Anxiety   . Atypical chest pain 06/19/2016  . Back pain   . Brain fog   . DDD (degenerative disc disease), lumbar   . Dyspnea   . Essential hypertension 06/19/2016  . Family history of degenerative disc disease   . Gallbladder problem   . Hot flashes   . Hyperlipidemia   . Hyperlipidemia 06/19/2016  . Hypertension   . Joint pain   . Lower extremity edema   . Menopause   . OSA (obstructive sleep apnea) 08/13/2020  . Trouble in sleeping   . Vitamin D deficiency   . Weight gain     Past Surgical History:  Procedure Laterality Date  . BREAST BIOPSY Left 08/07/2013   sclerosing adenosis   . BREAST BIOPSY Left 08/04/2013   fibroadenoma.  Marland Kitchen GALLBLADDER SURGERY      MEDS:   Current Outpatient Medications on File Prior to Visit  Medication Sig Dispense Refill  . amLODipine (NORVASC) 5 MG tablet TAKE 1 TABLET (5 MG TOTAL) BY MOUTH DAILY. 30 tablet 10  . Cholecalciferol (VITAMIN D) 50 MCG (2000 UT) CAPS Take 1 capsule by mouth daily.    Marland Kitchen ibuprofen (ADVIL) 800 MG tablet Take 800 mg by mouth 3 (three) times daily.    Marland Kitchen loratadine (CLARITIN) 10 MG tablet Take 10 mg by  mouth daily.    . Magnesium 400 MG TABS Take 1 tablet by mouth daily.    . methocarbamol (ROBAXIN) 500 MG tablet As needed.    Marland Kitchen olmesartan-hydrochlorothiazide (BENICAR HCT) 40-12.5 MG tablet Take 1 tablet by mouth daily. 90 tablet 3  . traMADol (ULTRAM) 50 MG tablet Take 50 mg by mouth every 6 (six) hours as needed for pain.     No current facility-administered medications on file prior to visit.    ALLERGIES: Amoxicillin-pot clavulanate  Family History  Problem Relation Age of Onset  . Hypertension Mother   . Anxiety disorder Mother   . Hypertension Father   . CAD Father   . Psoriasis Father   . Hyperlipidemia Father   . Cancer Father   . Diabetes Maternal Grandmother   . CAD Maternal Grandmother   . Cancer Paternal Grandmother   . Cancer Paternal Grandfather   . Stroke Maternal Aunt   . Healthy Brother   . Healthy Son   . Healthy Son   . Autoimmune disease Son   . Psoriasis Son   . Vitiligo Son   . Breast cancer Neg Hx     SH:  Married, non smoker  Review of Systems  Genitourinary: Positive for vaginal bleeding.    PHYSICAL EXAMINATION:    BP 125/77   Pulse 74  Wt 222 lb (100.7 kg)   LMP 10/19/2018 (Exact Date)   BMI 36.38 kg/m     General appearance: alert, cooperative and appears stated age Lymph:  no inguinal LAD noted  Pelvic: External genitalia:  no lesions              Urethra:  normal appearing urethra with no masses, tenderness or lesions              Bartholins and Skenes: normal                 Vagina: normal appearing vagina with normal color and discharge, no lesions              Cervix: no lesions              Bimanual Exam:  Uterus:  normal size, contour, position, consistency, mobility, non-tender              Adnexa: no mass, fullness, tenderness  Endometrial biopsy recommended.  Discussed with patient.  Verbal and written consent obtained.   Procedure:  Speculum placed.  Cervix visualized and cleansed with betadine prep.  A single  toothed tenaculum was applied to the anterior lip of the cervix.  Endometrial pipelle was advanced through the cervix into the endometrial cavity without difficulty.  Pipelle passed to 7cm.  Suction applied and pipelle removed with good tissue sample obtained.  Tenculum removed.  No bleeding noted.  Patient tolerated procedure well.  Chaperone, Octaviano Batty, CMA, was present for exam.  Assessment/Plan: 1. Postmenopausal bleeding - Surgical pathology( [place]/ [facility])

## 2020-09-03 LAB — SURGICAL PATHOLOGY

## 2020-09-06 ENCOUNTER — Other Ambulatory Visit (HOSPITAL_BASED_OUTPATIENT_CLINIC_OR_DEPARTMENT_OTHER): Payer: Self-pay | Admitting: Obstetrics & Gynecology

## 2020-09-06 ENCOUNTER — Telehealth (HOSPITAL_BASED_OUTPATIENT_CLINIC_OR_DEPARTMENT_OTHER): Payer: Self-pay | Admitting: Obstetrics & Gynecology

## 2020-09-06 DIAGNOSIS — N950 Postmenopausal bleeding: Secondary | ICD-10-CM

## 2020-09-06 NOTE — Telephone Encounter (Signed)
Called pt.  Pathology reviewed and is negative.  Pt having RLQ pain so would like to proceed with ultrasound.  Order placed.

## 2020-09-09 ENCOUNTER — Ambulatory Visit (HOSPITAL_BASED_OUTPATIENT_CLINIC_OR_DEPARTMENT_OTHER): Payer: 59

## 2020-09-11 ENCOUNTER — Ambulatory Visit (HOSPITAL_BASED_OUTPATIENT_CLINIC_OR_DEPARTMENT_OTHER)
Admission: RE | Admit: 2020-09-11 | Discharge: 2020-09-11 | Disposition: A | Discharge status: Home / Self Care | Payer: 59 | Source: Ambulatory Visit | Attending: Obstetrics & Gynecology | Admitting: Obstetrics & Gynecology

## 2020-09-11 ENCOUNTER — Other Ambulatory Visit: Payer: Self-pay

## 2020-09-11 DIAGNOSIS — N950 Postmenopausal bleeding: Secondary | ICD-10-CM | POA: Diagnosis present

## 2020-09-25 ENCOUNTER — Other Ambulatory Visit: Payer: Self-pay

## 2020-09-25 ENCOUNTER — Ambulatory Visit (INDEPENDENT_AMBULATORY_CARE_PROVIDER_SITE_OTHER): Payer: 59 | Admitting: Obstetrics & Gynecology

## 2020-09-25 ENCOUNTER — Encounter (HOSPITAL_BASED_OUTPATIENT_CLINIC_OR_DEPARTMENT_OTHER): Payer: Self-pay | Admitting: Obstetrics & Gynecology

## 2020-09-25 VITALS — BP 122/83 | HR 79 | Wt 221.0 lb

## 2020-09-25 DIAGNOSIS — N950 Postmenopausal bleeding: Secondary | ICD-10-CM | POA: Diagnosis not present

## 2020-09-25 DIAGNOSIS — R9389 Abnormal findings on diagnostic imaging of other specified body structures: Secondary | ICD-10-CM

## 2020-09-25 MED ORDER — NONFORMULARY OR COMPOUNDED ITEM: 3 refills | Status: DC

## 2020-09-26 ENCOUNTER — Encounter (HOSPITAL_BASED_OUTPATIENT_CLINIC_OR_DEPARTMENT_OTHER): Payer: Self-pay | Admitting: *Deleted

## 2020-09-26 ENCOUNTER — Telehealth: Payer: Self-pay | Admitting: *Deleted

## 2020-09-26 NOTE — Telephone Encounter (Signed)
Call to patient. Discussed surgery date options. Patient has travel plans week of 7-9 and requests to delay procedure till end of July. Discussed date of 7-26 and patient is agreeable. Advised will schedule and time will be determined closer to surgery date. Patient currently driving and requests My Chart message with contact information.  Encounter closed.

## 2020-09-27 ENCOUNTER — Other Ambulatory Visit (HOSPITAL_BASED_OUTPATIENT_CLINIC_OR_DEPARTMENT_OTHER): Payer: Self-pay | Admitting: Obstetrics & Gynecology

## 2020-09-27 NOTE — Progress Notes (Signed)
GYNECOLOGY  VISIT  CC:   discuss possible surgery  HPI: 55 y.o. G3P0 Married White or Caucasian female here for discussion of results and possible surgery.  Pt has hx of PMP bleeding and endometrial biopsy showing benign inactive endometrium.  Ultrasound showed 46mm endometrial thickness.  Bleeding has stopped.  Due to thickened endometrium and PMP bleeding with atrophic biopsy, additional investigation was recommended including hysteroscopy with possible polyp resection, dilation and curettage.  She has many questions that were discussed today.  With no more bleeding, questions procedure.  Thickened endometrium and bleeding reviewed.  Feel additional evaluation is warranted.  Procedure discussed with patient.  Recovery and pain management discussed.  Risks discussed including but not limited to bleeding, rare risk of transfusion, infection, 1% risk of uterine perforation with risks of fluid deficit causing cardiac arrythmia, cerebral swelling and/or need to stop procedure early.  Fluid emboli and rare risk of death discussed.  DVT/PE, rare risk of risk of bowel/bladder/ureteral/vascular injury.  Patient aware if pathology abnormal she may need additional treatment.  She does have some concerned about her sleep apnea and surgical risks.  Reviewed with pt type of anesthesia that will be used as well as PACU recovery.  All questions answered.  Pt is comfortable proceeding and getting scheduled.    GYNECOLOGIC HISTORY: Patient's last menstrual period was 10/19/2018 (exact date). Contraception: PMP  Patient Active Problem List   Diagnosis Date Noted   OSA (obstructive sleep apnea) 08/13/2020   Insulin resistance 08/28/2019   Class 2 severe obesity with serious comorbidity and body mass index (BMI) of 37.0 to 37.9 in adult [facility]) 07/03/2019   Essential hypertension 06/19/2016   Hyperlipidemia 06/19/2016    Past Medical History:  Diagnosis Date   Anxiety    Atypical chest pain 06/19/2016   Back pain     Brain fog    DDD (degenerative disc disease), lumbar    Dyspnea    Essential hypertension 06/19/2016   Family history of degenerative disc disease    Gallbladder problem    Hot flashes    Hyperlipidemia    Hyperlipidemia 06/19/2016   Hypertension    Joint pain    Lower extremity edema    Menopause    OSA (obstructive sleep apnea) 08/13/2020   Trouble in sleeping    Vitamin D deficiency    Weight gain     Past Surgical History:  Procedure Laterality Date   BREAST BIOPSY Left 08/07/2013   sclerosing adenosis    BREAST BIOPSY Left 08/04/2013   fibroadenoma.   GALLBLADDER SURGERY      MEDS:   Current Outpatient Medications on File Prior to Visit  Medication Sig Dispense Refill   amLODipine (NORVASC) 5 MG tablet TAKE 1 TABLET (5 MG TOTAL) BY MOUTH DAILY. 30 tablet 10   Cholecalciferol (VITAMIN D) 50 MCG (2000 UT) CAPS Take 1 capsule by mouth daily.     ibuprofen (ADVIL) 800 MG tablet Take 800 mg by mouth 3 (three) times daily.     loratadine (CLARITIN) 10 MG tablet Take 10 mg by mouth daily.     Magnesium 400 MG TABS Take 1 tablet by mouth daily.     methocarbamol (ROBAXIN) 500 MG tablet As needed.     olmesartan-hydrochlorothiazide (BENICAR HCT) 40-12.5 MG tablet Take 1 tablet by mouth daily. 90 tablet 3   traMADol (ULTRAM) 50 MG tablet Take 50 mg by mouth every 6 (six) hours as needed for pain.     No current facility-administered medications  on file prior to visit.    ALLERGIES: Amoxicillin-pot clavulanate  Family History  Problem Relation Age of Onset   Hypertension Mother    Anxiety disorder Mother    Hypertension Father    CAD Father    Psoriasis Father    Hyperlipidemia Father    Cancer Father    Diabetes Maternal Grandmother    CAD Maternal Grandmother    Cancer Paternal Grandmother    Cancer Paternal Grandfather    Stroke Maternal Aunt    Healthy Brother    Healthy Son    Healthy Son    Autoimmune disease Son    Psoriasis Son    Vitiligo Son     Breast cancer Neg Hx     SH:  married, non smoker  Review of Systems  Constitutional: Negative.   Genitourinary: Negative.   Psychiatric/Behavioral: Negative.     PHYSICAL EXAMINATION:    BP 122/83   Pulse 79   Wt 221 lb (100.2 kg)   LMP 10/19/2018 (Exact Date)   BMI 36.22 kg/m     Physical Exam Constitutional:      Appearance: Normal appearance.  Pulmonary:     Effort: Pulmonary effort is normal.  Neurological:     General: No focal deficit present.     Mental Status: She is alert.  Psychiatric:        Mood and Affect: Mood normal.   Assessment/Plan: 1. Postmenopausal bleeding - will plan to proceed with hysteroscopy, possible polyp resection, D&C.  Information will be sent to surgery scheduling.  2. Thickened endometrium  Total time with pt plus documentation was 33 minutes

## 2020-10-22 ENCOUNTER — Encounter (HOSPITAL_BASED_OUTPATIENT_CLINIC_OR_DEPARTMENT_OTHER): Payer: 59 | Admitting: Obstetrics & Gynecology

## 2020-10-22 ENCOUNTER — Encounter (HOSPITAL_BASED_OUTPATIENT_CLINIC_OR_DEPARTMENT_OTHER): Payer: Self-pay

## 2020-10-31 ENCOUNTER — Encounter (HOSPITAL_BASED_OUTPATIENT_CLINIC_OR_DEPARTMENT_OTHER): Payer: Self-pay

## 2020-11-12 ENCOUNTER — Ambulatory Visit (HOSPITAL_COMMUNITY): Admission: RE | Admit: 2020-11-12 | Payer: 59 | Source: Home / Self Care | Admitting: Obstetrics & Gynecology

## 2020-11-12 ENCOUNTER — Encounter (HOSPITAL_COMMUNITY): Admission: RE | Payer: Self-pay | Source: Home / Self Care

## 2020-11-12 SURGERY — DILATATION & CURETTAGE/HYSTEROSCOPY WITH MYOSURE
Anesthesia: Choice

## 2020-12-03 ENCOUNTER — Ambulatory Visit (HOSPITAL_BASED_OUTPATIENT_CLINIC_OR_DEPARTMENT_OTHER): Payer: 59 | Admitting: Obstetrics & Gynecology

## 2020-12-26 ENCOUNTER — Telehealth (HOSPITAL_BASED_OUTPATIENT_CLINIC_OR_DEPARTMENT_OTHER): Payer: Self-pay

## 2020-12-26 NOTE — Telephone Encounter (Signed)
Patient states she was scheduled to have a procedure with Dr. Sabra Heck in July. It was cancelled due to her having Covid. She is now wanting to get back on the schedule to be seen. Before she is scheduled for procedure, she is wanting to have some additional scans done first. She would like to speak to Dr. Sabra Heck before scheduling. tbw

## 2021-01-08 ENCOUNTER — Telehealth (HOSPITAL_BASED_OUTPATIENT_CLINIC_OR_DEPARTMENT_OTHER): Payer: Self-pay | Admitting: Obstetrics & Gynecology

## 2021-01-08 NOTE — Telephone Encounter (Signed)
Patient called today and would like Maudie Mercury the nurse to please give her a call.

## 2021-01-08 NOTE — Telephone Encounter (Signed)
Pt called today with symptoms she had been having. She was originally scheduled for a D&C in July but had Covid so that had to be postponed. She says that now she is experiencing bloating and a fullness/heaviness in the pelvic area. She describes it as a fullness like she felt when she was pregnant. She states that she feels it more on the right side. She is asking if she should have a scan done before rescheduling the surgery. Advised that I would send her concerns to the provider and get back with her on recommendations. Pt verbalized understanding.

## 2021-01-10 ENCOUNTER — Other Ambulatory Visit (HOSPITAL_BASED_OUTPATIENT_CLINIC_OR_DEPARTMENT_OTHER): Payer: Self-pay | Admitting: Obstetrics & Gynecology

## 2021-01-10 DIAGNOSIS — R102 Pelvic and perineal pain: Secondary | ICD-10-CM

## 2021-01-14 NOTE — Telephone Encounter (Signed)
Have you spoken to this patient about her procedure?

## 2021-01-15 NOTE — Telephone Encounter (Signed)
Called pt and provided an appt with Dr. Sabra Heck for evaluation and discussion of issue.

## 2021-01-29 ENCOUNTER — Ambulatory Visit (HOSPITAL_BASED_OUTPATIENT_CLINIC_OR_DEPARTMENT_OTHER): Payer: 59 | Admitting: Obstetrics & Gynecology

## 2021-02-11 ENCOUNTER — Other Ambulatory Visit: Payer: Self-pay

## 2021-02-11 ENCOUNTER — Ambulatory Visit (INDEPENDENT_AMBULATORY_CARE_PROVIDER_SITE_OTHER): Payer: 59 | Admitting: Obstetrics & Gynecology

## 2021-02-11 ENCOUNTER — Encounter (HOSPITAL_BASED_OUTPATIENT_CLINIC_OR_DEPARTMENT_OTHER): Payer: Self-pay | Admitting: Obstetrics & Gynecology

## 2021-02-11 VITALS — BP 133/80 | HR 82 | Ht 66.0 in | Wt 226.2 lb

## 2021-02-11 DIAGNOSIS — R1031 Right lower quadrant pain: Secondary | ICD-10-CM | POA: Diagnosis not present

## 2021-02-17 ENCOUNTER — Encounter (HOSPITAL_BASED_OUTPATIENT_CLINIC_OR_DEPARTMENT_OTHER): Payer: Self-pay

## 2021-02-17 NOTE — Progress Notes (Signed)
GYNECOLOGY  VISIT  CC:   discuss pelvic pain  HPI: 55 y.o. G3P0 Married White or Caucasian female here for discussion of abdominal/RLQ pelvic pain that has been present for several months.  Pt does have h/o PMP bleeding and 31mm endometrial thickeness with negative endometrial biopsy.  Due to bleeding and 72mm endometrium, hysteroscopy with D&C was recommended.  Pt has not had any recent bleeding and has cancelled surgery.  Her bigger complaint is persistent RLQ pain, right abdominal pain.  She does sometimes have pain on the left.  H/O hip pain but this is different.  Pap negative with neg RH HPV 04/2019.  Endometrial biopsy with inactive endometrium 08/29/2020.  Bowel function normal.  No urinary issues.  No recent bleeding.  Desires additional recommendation.  Did have ultrasound 09/11/2020 with normal ovaries.  Patient Active Problem List   Diagnosis Date Noted   OSA (obstructive sleep apnea) 08/13/2020   Insulin resistance 08/28/2019   Class 2 severe obesity with serious comorbidity and body mass index (BMI) of 37.0 to 37.9 in adult [facility]) 07/03/2019   Essential hypertension 06/19/2016   Hyperlipidemia 06/19/2016    Past Medical History:  Diagnosis Date   Anxiety    Atypical chest pain 06/19/2016   Back pain    Brain fog    DDD (degenerative disc disease), lumbar    Dyspnea    Essential hypertension 06/19/2016   Family history of degenerative disc disease    Gallbladder problem    Hot flashes    Hyperlipidemia    Hyperlipidemia 06/19/2016   Hypertension    Joint pain    Lower extremity edema    Menopause    OSA (obstructive sleep apnea) 08/13/2020   Trouble in sleeping    Vitamin D deficiency    Weight gain     Past Surgical History:  Procedure Laterality Date   BREAST BIOPSY Left 08/07/2013   sclerosing adenosis    BREAST BIOPSY Left 08/04/2013   fibroadenoma.   GALLBLADDER SURGERY      MEDS:   Current Outpatient Medications on File Prior to Visit  Medication Sig Dispense  Refill   amLODipine (NORVASC) 5 MG tablet TAKE 1 TABLET (5 MG TOTAL) BY MOUTH DAILY. 30 tablet 10   betamethasone dipropionate 0.05 % cream Apply 1 application topically 2 (two) times daily.     Cholecalciferol (VITAMIN D) 50 MCG (2000 UT) CAPS Take 2,000 Units by mouth daily.     ibuprofen (ADVIL) 800 MG tablet Take 800 mg by mouth every 8 (eight) hours as needed for mild pain or moderate pain.     loratadine (CLARITIN) 10 MG tablet Take 10 mg by mouth daily as needed for allergies.     methocarbamol (ROBAXIN) 500 MG tablet Take 500 mg by mouth every 6 (six) hours as needed for muscle spasms.     NONFORMULARY OR COMPOUNDED ITEM Vitamin E vaginal suppositories 200u/ml.  One pv three times weekly. 36 each 3   olmesartan-hydrochlorothiazide (BENICAR HCT) 40-12.5 MG tablet Take 1 tablet by mouth daily. 90 tablet 3   Potassium 99 MG TABS Take 99 mg by mouth daily.     Rhubarb (ESTROVEN COMPLETE) 4 MG TABS Take 4 mg by mouth daily.     traMADol (ULTRAM) 50 MG tablet Take 50 mg by mouth every 6 (six) hours as needed for pain.     tretinoin (RETIN-A) 0.025 % cream Apply 1 application topically daily.     No current facility-administered medications on file prior to  visit.    ALLERGIES: Amoxicillin-pot clavulanate  Family History  Problem Relation Age of Onset   Hypertension Mother    Anxiety disorder Mother    Hypertension Father    CAD Father    Psoriasis Father    Hyperlipidemia Father    Cancer Father    Diabetes Maternal Grandmother    CAD Maternal Grandmother    Cancer Paternal Grandmother    Cancer Paternal Grandfather    Stroke Maternal Aunt    Healthy Brother    Healthy Son    Healthy Son    Autoimmune disease Son    Psoriasis Son    Vitiligo Son    Breast cancer Neg Hx     SH:  married, non smoker  Review of Systems  Constitutional: Negative.   Gastrointestinal:  Positive for abdominal pain.  Genitourinary: Negative.    PHYSICAL EXAMINATION:    BP 133/80 (BP  Location: Right Arm, Patient Position: Sitting, Cuff Size: Large)   Pulse 82   Ht 5\' 6"  (1.676 m)   Wt 226 lb 3.2 oz (102.6 kg)   LMP 10/19/2018 (Exact Date)   BMI 36.51 kg/m     Physical Exam Constitutional:      Appearance: Normal appearance.  Neurological:     General: No focal deficit present.     Mental Status: She is alert.  Psychiatric:        Mood and Affect: Mood normal.     Assessment/Plan: 1. RLQ abdominal pain - CT ABDOMEN PELVIS W CONTRAST; Future

## 2021-02-21 ENCOUNTER — Encounter (HOSPITAL_BASED_OUTPATIENT_CLINIC_OR_DEPARTMENT_OTHER): Payer: Self-pay | Admitting: *Deleted

## 2021-02-25 ENCOUNTER — Other Ambulatory Visit: Payer: Self-pay | Admitting: Otolaryngology

## 2021-02-25 DIAGNOSIS — H903 Sensorineural hearing loss, bilateral: Secondary | ICD-10-CM

## 2021-02-26 ENCOUNTER — Ambulatory Visit
Admission: RE | Admit: 2021-02-26 | Discharge: 2021-02-26 | Disposition: A | Discharge status: Home / Self Care | Payer: 59 | Source: Ambulatory Visit | Attending: Obstetrics & Gynecology | Admitting: Obstetrics & Gynecology

## 2021-02-26 ENCOUNTER — Other Ambulatory Visit: Payer: Self-pay

## 2021-02-26 DIAGNOSIS — R1031 Right lower quadrant pain: Secondary | ICD-10-CM

## 2021-02-26 MED ORDER — IOPAMIDOL (ISOVUE-300) INJECTION 61%
100.0000 mL | Freq: Once | INTRAVENOUS | Status: AC | PRN
  Administered 2021-02-26: 100 mL via INTRAVENOUS

## 2021-02-27 ENCOUNTER — Other Ambulatory Visit (HOSPITAL_BASED_OUTPATIENT_CLINIC_OR_DEPARTMENT_OTHER): Payer: Self-pay | Admitting: Obstetrics & Gynecology

## 2021-02-27 DIAGNOSIS — K561 Intussusception: Secondary | ICD-10-CM

## 2021-02-28 ENCOUNTER — Encounter (HOSPITAL_BASED_OUTPATIENT_CLINIC_OR_DEPARTMENT_OTHER): Payer: Self-pay

## 2021-03-04 ENCOUNTER — Encounter (HOSPITAL_BASED_OUTPATIENT_CLINIC_OR_DEPARTMENT_OTHER): Payer: Self-pay

## 2021-03-18 ENCOUNTER — Other Ambulatory Visit: Payer: 59

## 2021-03-25 ENCOUNTER — Ambulatory Visit
Admission: RE | Admit: 2021-03-25 | Discharge: 2021-03-25 | Disposition: A | Discharge status: Home / Self Care | Payer: 59 | Source: Ambulatory Visit | Attending: Otolaryngology | Admitting: Otolaryngology

## 2021-03-25 ENCOUNTER — Other Ambulatory Visit: Payer: 59

## 2021-03-25 ENCOUNTER — Other Ambulatory Visit: Payer: Self-pay

## 2021-03-25 DIAGNOSIS — H903 Sensorineural hearing loss, bilateral: Secondary | ICD-10-CM

## 2021-03-25 MED ORDER — GADOBENATE DIMEGLUMINE 529 MG/ML IV SOLN
20.0000 mL | Freq: Once | INTRAVENOUS | Status: AC | PRN
  Administered 2021-03-25: 20 mL via INTRAVENOUS

## 2021-04-03 ENCOUNTER — Other Ambulatory Visit: Payer: Self-pay | Admitting: Cardiovascular Disease

## 2021-04-20 DIAGNOSIS — D333 Benign neoplasm of cranial nerves: Secondary | ICD-10-CM

## 2021-04-20 HISTORY — DX: Benign neoplasm of cranial nerves: D33.3

## 2021-04-23 DIAGNOSIS — K561 Intussusception: Secondary | ICD-10-CM | POA: Diagnosis not present

## 2021-04-25 ENCOUNTER — Encounter (HOSPITAL_BASED_OUTPATIENT_CLINIC_OR_DEPARTMENT_OTHER): Payer: Self-pay | Admitting: Obstetrics & Gynecology

## 2021-05-07 ENCOUNTER — Encounter (HOSPITAL_BASED_OUTPATIENT_CLINIC_OR_DEPARTMENT_OTHER): Payer: Self-pay

## 2021-05-07 ENCOUNTER — Telehealth: Payer: Self-pay

## 2021-05-07 NOTE — Telephone Encounter (Signed)
Called patient to discuss potential surgery date and time, patient opted for 03/22 date.

## 2021-05-12 ENCOUNTER — Encounter (HOSPITAL_BASED_OUTPATIENT_CLINIC_OR_DEPARTMENT_OTHER): Payer: Self-pay | Admitting: Cardiovascular Disease

## 2021-05-16 DIAGNOSIS — D333 Benign neoplasm of cranial nerves: Secondary | ICD-10-CM | POA: Diagnosis not present

## 2021-05-19 DIAGNOSIS — D333 Benign neoplasm of cranial nerves: Secondary | ICD-10-CM | POA: Diagnosis not present

## 2021-05-19 DIAGNOSIS — H9041 Sensorineural hearing loss, unilateral, right ear, with unrestricted hearing on the contralateral side: Secondary | ICD-10-CM | POA: Diagnosis not present

## 2021-05-21 ENCOUNTER — Other Ambulatory Visit (HOSPITAL_BASED_OUTPATIENT_CLINIC_OR_DEPARTMENT_OTHER): Payer: Self-pay | Admitting: Obstetrics & Gynecology

## 2021-05-21 DIAGNOSIS — I10 Essential (primary) hypertension: Secondary | ICD-10-CM

## 2021-05-21 DIAGNOSIS — Z01818 Encounter for other preprocedural examination: Secondary | ICD-10-CM

## 2021-05-21 DIAGNOSIS — R140 Abdominal distension (gaseous): Secondary | ICD-10-CM | POA: Diagnosis not present

## 2021-05-21 DIAGNOSIS — R1031 Right lower quadrant pain: Secondary | ICD-10-CM | POA: Diagnosis not present

## 2021-05-21 DIAGNOSIS — K581 Irritable bowel syndrome with constipation: Secondary | ICD-10-CM | POA: Diagnosis not present

## 2021-05-26 DIAGNOSIS — H9041 Sensorineural hearing loss, unilateral, right ear, with unrestricted hearing on the contralateral side: Secondary | ICD-10-CM | POA: Diagnosis not present

## 2021-05-28 ENCOUNTER — Encounter (HOSPITAL_BASED_OUTPATIENT_CLINIC_OR_DEPARTMENT_OTHER): Payer: Self-pay | Admitting: Cardiovascular Disease

## 2021-05-29 ENCOUNTER — Telehealth (HOSPITAL_BASED_OUTPATIENT_CLINIC_OR_DEPARTMENT_OTHER): Payer: Self-pay | Admitting: *Deleted

## 2021-05-29 NOTE — Telephone Encounter (Signed)
Mychart message below sent  Hi Dr Oval Linsey, I was diagnosed with an acoustic neuroma (benign tumor)on the vestibular nerve in December.  I am scheduled for surgery to remove in July.  In the meantime my specialist team of neuro surgeons have suggested starting a regime of one 81mg  aspirin daily to hopefully limit tumor growth over the next several months.  I felt it would be a good idea to consult with you before starting this.  I know both of my parents have been on an aspirin since their mid 60's just as part of their heart regime & don't seem to have had any issues so I felt this probably wasn't an issue but again just wanted another trusted opinion.  I will discontinue the aspirin 30 days prior to surgery. Please let me know if you feel I am ok to start this. Thank you Lori Luna  Will forward to Dr Oval Linsey for review

## 2021-06-18 DIAGNOSIS — D333 Benign neoplasm of cranial nerves: Secondary | ICD-10-CM | POA: Diagnosis not present

## 2021-06-19 DIAGNOSIS — D333 Benign neoplasm of cranial nerves: Secondary | ICD-10-CM | POA: Diagnosis not present

## 2021-06-20 NOTE — Telephone Encounter (Signed)
May 29, 2021 ?Skeet Latch, MD ?to Me   ?   1:06 PM ?I think it is fine to take aspirin for this purpose.  ? ?TCR  ? ?Late entry-- mychart message sent 2/9 ?

## 2021-06-26 DIAGNOSIS — H9042 Sensorineural hearing loss, unilateral, left ear, with unrestricted hearing on the contralateral side: Secondary | ICD-10-CM | POA: Diagnosis not present

## 2021-06-26 DIAGNOSIS — D333 Benign neoplasm of cranial nerves: Secondary | ICD-10-CM | POA: Diagnosis not present

## 2021-06-30 ENCOUNTER — Telehealth (HOSPITAL_BASED_OUTPATIENT_CLINIC_OR_DEPARTMENT_OTHER): Payer: Self-pay

## 2021-06-30 MED ORDER — OLMESARTAN MEDOXOMIL-HCTZ 40-12.5 MG PO TABS: 1.0000 | ORAL_TABLET | Freq: Every day | ORAL | 3 refills | Status: DC

## 2021-06-30 NOTE — Telephone Encounter (Signed)
Refilled as requested, mychart message sent to patient  ?

## 2021-06-30 NOTE — Telephone Encounter (Signed)
New message    1. Which medications need to be refilled? (please list name of each medication and dose if known) olmesartan-hydrochlorothiazide (BENICAR HCT) 40-12.5 MG tablet  2. Which pharmacy/location (including street and city if local pharmacy) is medication to be sent to [place] [phone]  3. Do they need a 30 day or 90 day supply?90 days supply

## 2021-07-02 ENCOUNTER — Encounter (HOSPITAL_BASED_OUTPATIENT_CLINIC_OR_DEPARTMENT_OTHER): Payer: Self-pay | Admitting: Obstetrics & Gynecology

## 2021-07-02 ENCOUNTER — Other Ambulatory Visit (HOSPITAL_COMMUNITY)
Admission: RE | Admit: 2021-07-02 | Discharge: 2021-07-02 | Disposition: A | Discharge status: Home / Self Care | Payer: BC Managed Care – PPO | Source: Ambulatory Visit | Attending: Obstetrics & Gynecology | Admitting: Obstetrics & Gynecology

## 2021-07-02 ENCOUNTER — Other Ambulatory Visit: Payer: Self-pay

## 2021-07-02 ENCOUNTER — Ambulatory Visit (INDEPENDENT_AMBULATORY_CARE_PROVIDER_SITE_OTHER): Payer: BC Managed Care – PPO | Admitting: Obstetrics & Gynecology

## 2021-07-02 VITALS — BP 139/85 | HR 76 | Ht 66.0 in | Wt 226.2 lb

## 2021-07-02 DIAGNOSIS — N898 Other specified noninflammatory disorders of vagina: Secondary | ICD-10-CM

## 2021-07-02 DIAGNOSIS — N760 Acute vaginitis: Secondary | ICD-10-CM | POA: Insufficient documentation

## 2021-07-02 DIAGNOSIS — N950 Postmenopausal bleeding: Secondary | ICD-10-CM

## 2021-07-02 DIAGNOSIS — R935 Abnormal findings on diagnostic imaging of other abdominal regions, including retroperitoneum: Secondary | ICD-10-CM | POA: Diagnosis not present

## 2021-07-02 DIAGNOSIS — Z124 Encounter for screening for malignant neoplasm of cervix: Secondary | ICD-10-CM

## 2021-07-02 MED ORDER — NONFORMULARY OR COMPOUNDED ITEM: 3 refills | Status: DC

## 2021-07-02 NOTE — Progress Notes (Addendum)
Spoke w/ via phone for pre-op interview--- [place] ?Lab needs dos----               ?Lab results------Lab appt 07/07/21 for CBC, BMP, T&S and EKG. ?COVID test -----patient states asymptomatic no test needed ?Arrive at -------0530 ?NPO after MN NO Solid Food.   ?Med rec completed ?Medications to take morning of surgery ----- Norvasc and Claritin PRN. ?Diabetic medication ----- ?Patient instructed no nail polish to be worn day of surgery ?Patient instructed to bring photo id and insurance card day of surgery ?Patient aware to have Driver (ride ) / caregiver  Husband Lori Luna   for 24 hours after surgery  ?Patient Special Instructions ----- Patient has sleep Apnea but does not wear a CPAP. ?Pre-Op special Istructions -----  ?Patient verbalized understanding of instructions that were given at this phone interview. ?Patient denies shortness of breath, chest pain, fever, cough at this phone interview.  ?

## 2021-07-03 ENCOUNTER — Encounter (HOSPITAL_BASED_OUTPATIENT_CLINIC_OR_DEPARTMENT_OTHER): Payer: Self-pay | Admitting: Obstetrics & Gynecology

## 2021-07-03 ENCOUNTER — Encounter (HOSPITAL_BASED_OUTPATIENT_CLINIC_OR_DEPARTMENT_OTHER): Payer: BC Managed Care – PPO | Admitting: Obstetrics & Gynecology

## 2021-07-03 LAB — CERVICOVAGINAL ANCILLARY ONLY
Bacterial Vaginitis (gardnerella): POSITIVE — AB
Candida Glabrata: NEGATIVE
Candida Vaginitis: NEGATIVE
Comment: NEGATIVE
Comment: NEGATIVE
Comment: NEGATIVE

## 2021-07-03 NOTE — Progress Notes (Signed)
56 y.o. G3P0 Married White or Caucasian female here for discussion of upcoming procedure.  Hysteroscopy with possible polyp resection, D&C planned due to PMP bleeding and abnormal endometrium on ultrasound.  These issues started last year in May.  Endometrial biopsy was negative.  Ultrasound showed thickened endometrium.  She's had several other issues that have occurred since then including the finding of intussusception in the LUQ.  She has seen Dr. Rosendo Gros for consultation who did not recommend surgery.  She continues to have some RLQ discomfort.  She has been seen by GI as well and is currently on a regimen including miralax to help with motility.  This seems to have helped.  Lastly, she has been diagnosed with an acoustic neuroma and is being followed by Dr. Harrison Mons [facility] in neurosurgery.  Microsurgical resection was offered.  Pt is considering.  All of this updated in Epic. ? ?Pt reports she is having a little yellowish discharge and would like this evaluated today.   ? ?Procedure discussed with patient.  Hospital stay, recovery and pain management all discussed.  Risks discussed including but not limited to bleeding, 1% risk of receiving a  transfusion, infection, 3-4% risk of bowel/bladder/ureteral/vascular injury discussed as well as possible need for additional surgery if injury does occur discussed.  DVT/PE and rare risk of death discussed.  My actual complications with prior surgeries discussed.  Vaginal cuff dehiscence discussed.  Hernia formation discussed.  Positioning and incision locations discussed.  Patient aware if pathology abnormal she may need additional treatment.  All questions answered.    ? ?Ob Hx:   ?Patient's last menstrual period was 10/19/2018 (exact date).          ?PMP ?Last pap: 05/03/2020 ?Last MMG: 08/20/2020 ?Smoking: No  ? ?Past Surgical History:  ?Procedure Laterality Date  ? BREAST BIOPSY Left 08/07/2013  ? sclerosing adenosis   ? BREAST BIOPSY Left 08/04/2013  ?  fibroadenoma.  ? GALLBLADDER SURGERY    ? ? ?Past Medical History:  ?Diagnosis Date  ? Acoustic neuroma ([place]) 2023  ? Anxiety   ? Atypical chest pain 06/19/2016  ? Back pain   ? Brain fog   ? DDD (degenerative disc disease), lumbar   ? Dyspnea   ? Essential hypertension 06/19/2016  ? Family history of degenerative disc disease   ? Gallbladder problem   ? Hot flashes   ? Hyperlipidemia   ? Hyperlipidemia 06/19/2016  ? Hypertension   ? Joint pain   ? Lower extremity edema   ? Menopause   ? OSA (obstructive sleep apnea) 08/13/2020  ? Trouble in sleeping   ? Vitamin D deficiency   ? Weight gain   ? ? ?Allergies: Amoxicillin-pot clavulanate ? ?Current Outpatient Medications  ?Medication Sig Dispense Refill  ? amLODipine (NORVASC) 5 MG tablet TAKE 1 TABLET (5 MG TOTAL) BY MOUTH DAILY. 90 tablet 3  ? betamethasone dipropionate 0.05 % cream Apply 1 application topically 2 (two) times daily.    ? Cholecalciferol (VITAMIN D) 50 MCG (2000 UT) CAPS Take 2,000 Units by mouth daily.    ? ibuprofen (ADVIL) 800 MG tablet Take 800 mg by mouth every 8 (eight) hours as needed for mild pain or moderate pain.    ? loratadine (CLARITIN) 10 MG tablet Take 10 mg by mouth daily as needed for allergies.    ? methocarbamol (ROBAXIN) 500 MG tablet Take 500 mg by mouth every 6 (six) hours as needed for muscle spasms.    ? olmesartan-hydrochlorothiazide (  BENICAR HCT) 40-12.5 MG tablet Take 1 tablet by mouth daily. 90 tablet 3  ? Potassium 99 MG TABS Take 99 mg by mouth daily.    ? Rhubarb (ESTROVEN COMPLETE) 4 MG TABS Take 4 mg by mouth daily.    ? tretinoin (RETIN-A) 0.025 % cream Apply 1 application topically daily.    ? NONFORMULARY OR COMPOUNDED ITEM Vitamin E vaginal suppositories 200u/ml.  One pv three times weekly. 36 each 3  ? traMADol (ULTRAM) 50 MG tablet Take 50 mg by mouth every 6 (six) hours as needed for pain. (Patient not taking: Reported on 07/02/2021)    ? ?No current facility-administered medications for this visit.  ? ? ?ROS:  Pertinent items noted in HPI and remainder of comprehensive ROS otherwise negative. ? ?Exam:   ?BP 139/85 (BP Location: Right Leg, Patient Position: Sitting, Cuff Size: Large)   Pulse 76   Ht '5\' 6"'$  (1.676 m) Comment: Reported  Wt 226 lb 3.2 oz (102.6 kg)   LMP 10/19/2018 (Exact Date)   BMI 36.51 kg/m?   ?General appearance: alert and cooperative ?Head: Normocephalic, without obvious abnormality, atraumatic ?Neurologic: Grossly normal ? ?Pt obtained a self swab and there was pink present so pelvic exam performed today. ? ?Pelvic: External genitalia:  no lesions ?             Urethra: normal appearing urethra with no masses, tenderness or lesions ?             Bartholins and Skenes: normal    ?             Vagina: mild atrophic changes  ?             Cervix: normal appearance ?             Pap taken: Yes.   ?       Bimanual Exam:  Uterus:  uterus is normal size, shape, consistency and nontender ?                                     Adnexa:    not indicated ? ?Chaperone, Octaviano Batty, present for examination.  ? ?Assessment/Plan: ?1. Postmenopausal bleeding ?- hysteroscopy with possible polyp resection, D&C planned ?- pre and post op instructions reviewed ?- post op pain management discussed.  Prescriptions will be given day of surgery ?- Pt knows she needs to stop baby ASA five days prior to procedure ? ?2. Abnormal ultrasound of endometrium ? ?3. Vaginal discharge ?- Cervicovaginal ancillary only( [place]) ? ?4. Cervical cancer screening ?- Cytology - PAP( [place])  ? ? ?

## 2021-07-04 ENCOUNTER — Other Ambulatory Visit (HOSPITAL_BASED_OUTPATIENT_CLINIC_OR_DEPARTMENT_OTHER): Payer: Self-pay | Admitting: *Deleted

## 2021-07-04 LAB — CYTOLOGY - PAP
Comment: NEGATIVE
Diagnosis: NEGATIVE
High risk HPV: NEGATIVE

## 2021-07-04 MED ORDER — METRONIDAZOLE 0.75 % VA GEL: 1.0000 | Freq: Every day | VAGINAL | 0 refills | Status: DC

## 2021-07-04 NOTE — Progress Notes (Signed)
Rx sent for treatment of BV positive on aptima swab.  ?

## 2021-07-05 ENCOUNTER — Encounter (HOSPITAL_BASED_OUTPATIENT_CLINIC_OR_DEPARTMENT_OTHER): Payer: Self-pay | Admitting: Obstetrics & Gynecology

## 2021-07-07 ENCOUNTER — Encounter (HOSPITAL_COMMUNITY)
Admission: RE | Admit: 2021-07-07 | Discharge: 2021-07-07 | Disposition: A | Discharge status: Home / Self Care | Payer: BC Managed Care – PPO | Source: Ambulatory Visit | Attending: Obstetrics & Gynecology | Admitting: Obstetrics & Gynecology

## 2021-07-07 ENCOUNTER — Other Ambulatory Visit: Payer: Self-pay

## 2021-07-07 DIAGNOSIS — R9389 Abnormal findings on diagnostic imaging of other specified body structures: Secondary | ICD-10-CM | POA: Diagnosis not present

## 2021-07-07 DIAGNOSIS — Z79899 Other long term (current) drug therapy: Secondary | ICD-10-CM | POA: Diagnosis not present

## 2021-07-07 DIAGNOSIS — Z01818 Encounter for other preprocedural examination: Secondary | ICD-10-CM | POA: Insufficient documentation

## 2021-07-07 DIAGNOSIS — R102 Pelvic and perineal pain: Secondary | ICD-10-CM | POA: Diagnosis not present

## 2021-07-07 DIAGNOSIS — G4733 Obstructive sleep apnea (adult) (pediatric): Secondary | ICD-10-CM | POA: Diagnosis not present

## 2021-07-07 DIAGNOSIS — I10 Essential (primary) hypertension: Secondary | ICD-10-CM | POA: Insufficient documentation

## 2021-07-07 DIAGNOSIS — E785 Hyperlipidemia, unspecified: Secondary | ICD-10-CM | POA: Diagnosis not present

## 2021-07-07 LAB — BASIC METABOLIC PANEL
Anion gap: 6 (ref 5–15)
BUN: 17 mg/dL (ref 6–20)
CO2: 27 mmol/L (ref 22–32)
Calcium: 9.3 mg/dL (ref 8.9–10.3)
Chloride: 106 mmol/L (ref 98–111)
Creatinine, Ser: 0.62 mg/dL (ref 0.44–1.00)
GFR, Estimated: >60 mL/min (ref >60)
Glucose, Bld: 95 mg/dL (ref 70–99)
Potassium: 3.7 mmol/L (ref 3.5–5.1)
Sodium: 139 mmol/L (ref 135–145)

## 2021-07-07 LAB — CBC
HCT: 40.7 % (ref 36.0–46.0)
Hemoglobin: 13.8 g/dL (ref 12.0–15.0)
MCH: 29.2 pg (ref 26.0–34.0)
MCHC: 33.9 g/dL (ref 30.0–36.0)
MCV: 86.2 fL (ref 80.0–100.0)
Platelets: 283 10*3/uL (ref 150–400)
RBC: 4.72 MIL/uL (ref 3.87–5.11)
RDW: 12.5 % (ref 11.5–15.5)
WBC: 5 10*3/uL (ref 4.0–10.5)
nRBC: 0 % (ref 0.0–0.2)

## 2021-07-08 NOTE — Anesthesia Preprocedure Evaluation (Addendum)
Anesthesia Evaluation  ?Patient identified by MRN, date of birth, ID band ?Patient awake ? ? ? ?Reviewed: ?Allergy & Precautions, NPO status , Patient's Chart, lab work & pertinent test results ? ?History of Anesthesia Complications ?(+) PONV and history of anesthetic complications ? ?Airway ?Mallampati: II ? ?TM Distance: >3 FB ?Neck ROM: Full ? ? ? Dental ?no notable dental hx. ? ?  ?Pulmonary ?sleep apnea ,  ?  ?Pulmonary exam normal ?breath sounds clear to auscultation ? ? ? ? ? ? Cardiovascular ?Exercise Tolerance: Good ?hypertension, Pt. on medications ?Normal cardiovascular exam ?Rhythm:Regular Rate:Normal ? ?Normal sinus rhythm ?Cannot rule out Anterior infarct , age undetermined ?Abnormal ECG ?When compared with ECG of 22-Sep-2018 04:42, ?PREVIOUS ECG IS PRESENT ?No significant change since last tracing ?Confirmed by Charolette Forward [phone]) on 07/07/2021 4:31:50 PM ?  ?Neuro/Psych ?PSYCHIATRIC DISORDERS Anxiety  Neuromuscular disease (acoustic neuroma; vestibular schwannoma)   ? GI/Hepatic ?negative GI ROS, Neg liver ROS,   ?Endo/Other  ? ? Renal/GU ?negative Renal ROS  ?negative genitourinary ?  ?Musculoskeletal ? ?(+) Arthritis , Osteoarthritis,   ? Abdominal ?  ?Peds ?negative pediatric ROS ?(+)  Hematology ?negative hematology ROS ?(+)   ?Anesthesia Other Findings ? ? Reproductive/Obstetrics ? ?  ? ? ? ? ? ? ? ? ? ? ? ? ? ?  ?  ? ? ? ? ? ? ? ?Anesthesia Physical ?Anesthesia Plan ? ?ASA: 3 ? ?Anesthesia Plan: General  ? ?Post-op Pain Management: Tylenol PO (pre-op)*  ? ?Induction: Intravenous ? ?PONV Risk Score and Plan: 3 and Treatment may vary due to age or medical condition, Midazolam, Ondansetron, Dexamethasone and Scopolamine patch - Pre-op ? ?Airway Management Planned: LMA ? ?Additional Equipment: None ? ?Intra-op Plan:  ? ?Post-operative Plan: Extubation in OR ? ?Informed Consent: I have reviewed the patients History and Physical, chart, labs and discussed the  procedure including the risks, benefits and alternatives for the proposed anesthesia with the patient or authorized representative who has indicated his/her understanding and acceptance.  ? ? ? ?Dental advisory given ? ?Plan Discussed with: CRNA, Anesthesiologist and Surgeon ? ?Anesthesia Plan Comments:   ? ? ? ? ? ?Anesthesia Quick Evaluation ? ?

## 2021-07-09 ENCOUNTER — Ambulatory Visit (HOSPITAL_BASED_OUTPATIENT_CLINIC_OR_DEPARTMENT_OTHER): Payer: BC Managed Care – PPO | Admitting: Anesthesiology

## 2021-07-09 ENCOUNTER — Other Ambulatory Visit: Payer: Self-pay

## 2021-07-09 ENCOUNTER — Encounter (HOSPITAL_BASED_OUTPATIENT_CLINIC_OR_DEPARTMENT_OTHER): Payer: Self-pay | Admitting: Cardiovascular Disease

## 2021-07-09 ENCOUNTER — Encounter (HOSPITAL_BASED_OUTPATIENT_CLINIC_OR_DEPARTMENT_OTHER)
Admission: RE | Disposition: A | Discharge status: Home / Self Care | Payer: Self-pay | Source: Ambulatory Visit | Attending: Obstetrics & Gynecology

## 2021-07-09 ENCOUNTER — Encounter (HOSPITAL_BASED_OUTPATIENT_CLINIC_OR_DEPARTMENT_OTHER): Payer: Self-pay | Admitting: Obstetrics & Gynecology

## 2021-07-09 ENCOUNTER — Ambulatory Visit (HOSPITAL_BASED_OUTPATIENT_CLINIC_OR_DEPARTMENT_OTHER)
Admission: RE | Admit: 2021-07-09 | Discharge: 2021-07-09 | Disposition: A | Discharge status: Home / Self Care | Payer: BC Managed Care – PPO | Source: Ambulatory Visit | Attending: Obstetrics & Gynecology | Admitting: Obstetrics & Gynecology

## 2021-07-09 DIAGNOSIS — N840 Polyp of corpus uteri: Secondary | ICD-10-CM | POA: Diagnosis not present

## 2021-07-09 DIAGNOSIS — R102 Pelvic and perineal pain: Secondary | ICD-10-CM | POA: Diagnosis not present

## 2021-07-09 DIAGNOSIS — G4730 Sleep apnea, unspecified: Secondary | ICD-10-CM | POA: Diagnosis not present

## 2021-07-09 DIAGNOSIS — R9389 Abnormal findings on diagnostic imaging of other specified body structures: Secondary | ICD-10-CM | POA: Diagnosis not present

## 2021-07-09 DIAGNOSIS — N858 Other specified noninflammatory disorders of uterus: Secondary | ICD-10-CM | POA: Diagnosis not present

## 2021-07-09 DIAGNOSIS — Z79899 Other long term (current) drug therapy: Secondary | ICD-10-CM | POA: Insufficient documentation

## 2021-07-09 DIAGNOSIS — G4733 Obstructive sleep apnea (adult) (pediatric): Secondary | ICD-10-CM | POA: Diagnosis not present

## 2021-07-09 DIAGNOSIS — I10 Essential (primary) hypertension: Secondary | ICD-10-CM | POA: Insufficient documentation

## 2021-07-09 DIAGNOSIS — N950 Postmenopausal bleeding: Secondary | ICD-10-CM

## 2021-07-09 DIAGNOSIS — E785 Hyperlipidemia, unspecified: Secondary | ICD-10-CM | POA: Insufficient documentation

## 2021-07-09 DIAGNOSIS — F419 Anxiety disorder, unspecified: Secondary | ICD-10-CM | POA: Diagnosis not present

## 2021-07-09 HISTORY — PX: HYSTEROSCOPY WITH D & C: SHX1775

## 2021-07-09 HISTORY — DX: Nausea with vomiting, unspecified: R11.2

## 2021-07-09 HISTORY — DX: Other specified postprocedural states: Z98.890

## 2021-07-09 LAB — ABO/RH: ABO/RH(D): O NEG

## 2021-07-09 LAB — TYPE AND SCREEN
ABO/RH(D): O NEG
Antibody Screen: NEGATIVE

## 2021-07-09 SURGERY — DILATATION AND CURETTAGE /HYSTEROSCOPY
Anesthesia: General | Site: Uterus

## 2021-07-09 MED ORDER — ONDANSETRON HCL 4 MG/2ML IJ SOLN
INTRAMUSCULAR | Status: DC | PRN
  Administered 2021-07-09: 4 mg via INTRAVENOUS

## 2021-07-09 MED ORDER — LIDOCAINE 2% (20 MG/ML) 5 ML SYRINGE
INTRAMUSCULAR | Status: DC | PRN
  Administered 2021-07-09: 100 mg via INTRAVENOUS

## 2021-07-09 MED ORDER — TRAMADOL HCL 50 MG PO TABS: 50.0000 mg | ORAL_TABLET | Freq: Four times a day (QID) | ORAL | 0 refills | Status: DC | PRN

## 2021-07-09 MED ORDER — ONDANSETRON HCL 4 MG/2ML IJ SOLN: 4.0000 mg | Freq: Once | INTRAMUSCULAR | Status: DC | PRN

## 2021-07-09 MED ORDER — OXYCODONE HCL 5 MG/5ML PO SOLN: 5.0000 mg | Freq: Once | ORAL | Status: DC | PRN

## 2021-07-09 MED ORDER — DEXAMETHASONE SODIUM PHOSPHATE 10 MG/ML IJ SOLN
INTRAMUSCULAR | Status: DC | PRN
  Administered 2021-07-09: 10 mg via INTRAVENOUS

## 2021-07-09 MED ORDER — LIDOCAINE HCL (PF) 2 % IJ SOLN
INTRAMUSCULAR | Status: AC
  Filled 2021-07-09: qty 5

## 2021-07-09 MED ORDER — SCOPOLAMINE 1 MG/3DAYS TD PT72
1.0000 | MEDICATED_PATCH | TRANSDERMAL | Status: DC
  Administered 2021-07-09: 1.5 mg via TRANSDERMAL

## 2021-07-09 MED ORDER — PHENYLEPHRINE 40 MCG/ML (10ML) SYRINGE FOR IV PUSH (FOR BLOOD PRESSURE SUPPORT)
PREFILLED_SYRINGE | INTRAVENOUS | Status: DC | PRN
  Administered 2021-07-09: 80 ug via INTRAVENOUS

## 2021-07-09 MED ORDER — ONDANSETRON HCL 4 MG/2ML IJ SOLN
INTRAMUSCULAR | Status: AC
  Filled 2021-07-09: qty 2

## 2021-07-09 MED ORDER — KETOROLAC TROMETHAMINE 30 MG/ML IJ SOLN
INTRAMUSCULAR | Status: DC | PRN
Start: 2021-07-09 — End: 2021-07-09
  Administered 2021-07-09: 30 mg via INTRAVENOUS

## 2021-07-09 MED ORDER — FENTANYL CITRATE (PF) 100 MCG/2ML IJ SOLN
INTRAMUSCULAR | Status: DC | PRN
  Administered 2021-07-09 (×2): 50 ug via INTRAVENOUS

## 2021-07-09 MED ORDER — SODIUM CHLORIDE 0.9 % IR SOLN
Status: DC | PRN
Start: 2021-07-09 — End: 2021-07-09
  Administered 2021-07-09: 3000 mL

## 2021-07-09 MED ORDER — EPHEDRINE 5 MG/ML INJ
INTRAVENOUS | Status: AC
  Filled 2021-07-09: qty 5

## 2021-07-09 MED ORDER — PHENYLEPHRINE 40 MCG/ML (10ML) SYRINGE FOR IV PUSH (FOR BLOOD PRESSURE SUPPORT)
PREFILLED_SYRINGE | INTRAVENOUS | Status: AC
  Filled 2021-07-09: qty 10

## 2021-07-09 MED ORDER — EPHEDRINE SULFATE-NACL 50-0.9 MG/10ML-% IV SOSY
PREFILLED_SYRINGE | INTRAVENOUS | Status: DC | PRN
  Administered 2021-07-09: 10 mg via INTRAVENOUS

## 2021-07-09 MED ORDER — LIDOCAINE-EPINEPHRINE 1 %-1:100000 IJ SOLN
INTRAMUSCULAR | Status: DC | PRN
  Administered 2021-07-09: 10 mL

## 2021-07-09 MED ORDER — DEXAMETHASONE SODIUM PHOSPHATE 10 MG/ML IJ SOLN
INTRAMUSCULAR | Status: AC
  Filled 2021-07-09: qty 1

## 2021-07-09 MED ORDER — POVIDONE-IODINE 10 % EX SWAB: 2.0000 "application " | Freq: Once | CUTANEOUS | Status: DC

## 2021-07-09 MED ORDER — MIDAZOLAM HCL 5 MG/5ML IJ SOLN
INTRAMUSCULAR | Status: DC | PRN
  Administered 2021-07-09: 2 mg via INTRAVENOUS

## 2021-07-09 MED ORDER — IBUPROFEN 800 MG PO TABS: 800.0000 mg | ORAL_TABLET | Freq: Three times a day (TID) | ORAL | 0 refills | Status: AC | PRN

## 2021-07-09 MED ORDER — OXYCODONE HCL 5 MG PO TABS: 5.0000 mg | ORAL_TABLET | Freq: Once | ORAL | Status: DC | PRN

## 2021-07-09 MED ORDER — ACETAMINOPHEN 500 MG PO TABS
ORAL_TABLET | ORAL | Status: AC
  Filled 2021-07-09: qty 2

## 2021-07-09 MED ORDER — SCOPOLAMINE 1 MG/3DAYS TD PT72
MEDICATED_PATCH | TRANSDERMAL | Status: AC
  Filled 2021-07-09: qty 1

## 2021-07-09 MED ORDER — ACETAMINOPHEN 500 MG PO TABS
1000.0000 mg | ORAL_TABLET | Freq: Once | ORAL | Status: AC
  Administered 2021-07-09: 1000 mg via ORAL

## 2021-07-09 MED ORDER — PROPOFOL 500 MG/50ML IV EMUL
INTRAVENOUS | Status: AC
  Filled 2021-07-09: qty 50

## 2021-07-09 MED ORDER — LACTATED RINGERS IV SOLN: INTRAVENOUS | Status: DC

## 2021-07-09 MED ORDER — AMISULPRIDE (ANTIEMETIC) 5 MG/2ML IV SOLN: 10.0000 mg | Freq: Once | INTRAVENOUS | Status: DC | PRN

## 2021-07-09 MED ORDER — MIDAZOLAM HCL 2 MG/2ML IJ SOLN
INTRAMUSCULAR | Status: AC
Start: 2021-07-09 — End: ?
  Filled 2021-07-09: qty 2

## 2021-07-09 MED ORDER — FENTANYL CITRATE (PF) 100 MCG/2ML IJ SOLN
INTRAMUSCULAR | Status: AC
  Filled 2021-07-09: qty 2

## 2021-07-09 MED ORDER — KETOROLAC TROMETHAMINE 30 MG/ML IJ SOLN
INTRAMUSCULAR | Status: AC
  Filled 2021-07-09: qty 1

## 2021-07-09 MED ORDER — FENTANYL CITRATE (PF) 100 MCG/2ML IJ SOLN: 25.0000 ug | INTRAMUSCULAR | Status: DC | PRN

## 2021-07-09 MED ORDER — PROPOFOL 10 MG/ML IV BOLUS
INTRAVENOUS | Status: DC | PRN
  Administered 2021-07-09: 50 mg via INTRAVENOUS
  Administered 2021-07-09: 200 mg via INTRAVENOUS
  Administered 2021-07-09: 50 mg via INTRAVENOUS

## 2021-07-09 SURGICAL SUPPLY — 21 items
BIPOLAR CUTTING LOOP 21FR (ELECTRODE)
CATH ROBINSON RED A/P 16FR (CATHETERS) ×2 IMPLANT
DEVICE MYOSURE LITE (MISCELLANEOUS) ×1 IMPLANT
DEVICE MYOSURE REACH (MISCELLANEOUS) IMPLANT
DILATOR CANAL MILEX (MISCELLANEOUS) IMPLANT
DRSG TELFA 3X8 NADH (GAUZE/BANDAGES/DRESSINGS) ×2 IMPLANT
GAUZE 4X4 16PLY ~~LOC~~+RFID DBL (SPONGE) ×4 IMPLANT
GLOVE SURG LTX SZ6.5 (GLOVE) ×4 IMPLANT
GLOVE SURG UNDER POLY LF SZ7 (GLOVE) ×2 IMPLANT
GOWN STRL REUS W/TWL LRG LVL3 (GOWN DISPOSABLE) ×4 IMPLANT
IV NS IRRIG 3000ML ARTHROMATIC (IV SOLUTION) ×2 IMPLANT
KIT PROCEDURE FLUENT (KITS) ×2 IMPLANT
KIT TURNOVER CYSTO (KITS) ×2 IMPLANT
LOOP CUTTING BIPOLAR 21FR (ELECTRODE) IMPLANT
PACK VAGINAL MINOR WOMEN LF (CUSTOM PROCEDURE TRAY) ×2 IMPLANT
PAD DRESSING TELFA 3X8 NADH (GAUZE/BANDAGES/DRESSINGS) ×1 IMPLANT
PAD OB MATERNITY 4.3X12.25 (PERSONAL CARE ITEMS) ×2 IMPLANT
SEAL CERVICAL OMNI LOK (ABLATOR) IMPLANT
SEAL ROD LENS SCOPE MYOSURE (ABLATOR) ×2 IMPLANT
TOWEL OR 17X26 10 PK STRL BLUE (TOWEL DISPOSABLE) ×2 IMPLANT
WATER STERILE IRR 500ML POUR (IV SOLUTION) ×2 IMPLANT

## 2021-07-09 NOTE — Op Note (Signed)
07/09/2021 ? ?8:16 AM ? ?PATIENT:  Lori Luna  56 y.o. female ? ?PRE-OPERATIVE DIAGNOSIS:  Thickened Endometrium ?PMP bleeding ?Polyp ?Pelvic Pain ? ?POST-OPERATIVE DIAGNOSIS:  Thickened Endometrium ?PMP bleeding ?Polyp ?Pelvic Pain ? ?PROCEDURE:  Procedure(s): ?DILATATION AND CURETTAGE /HYSTEROSCOPY/myosure ? ?SURGEON:  Megan Salon ? ?ASSISTANTS: OR staff ? ?ANESTHESIA:   general ? ?ESTIMATED BLOOD LOSS: 5 mL ? ?BLOOD ADMINISTERED:none  ? ?FLUIDS: 500 cc LR ? ?UOP: 10cc ? ?SPECIMEN:  endometrial polyp and curetting ? ?DISPOSITION OF SPECIMEN:  PATHOLOGY ? ?FINDINGS: two polypoid anterior endometrial wall lesions c/w polyps, other atrophic appearing endometrium ? ?DESCRIPTION OF OPERATION: Patient was taken to the operating room.  She is placed in the supine position. SCDs were on her lower extremities and functioning properly. General anesthesia with an LMA was administered without difficulty. Dr. Elgie Congo, anesthesia, oversaw case.  Time our performed.   ? ?Legs were then placed in the [place] in the low lithotomy position. The legs were lifted to the high lithotomy position and the Betadine prep was used on the inner thighs perineum and vagina x3. Patient was draped in a normal standard fashion. An in and out catheterization with a red rubber Foley catheter was performed. Approximately 10 cc of clear urine was noted. A bivalve speculum was placed the vagina. The anterior lip of the cervix was grasped with single-tooth tenaculum.  A paracervical block of 1% lidocaine mixed one-to-one with epinephrine (1:100,000 units).  10 cc was used total. The cervix is dilated up to #19 [facility] dilators. The endometrial cavity sounded to 8 cm. ? ? A Myosure hysteroscope was obtained. Normal saline was used as a hysteroscopic fluid. The hysteroscope was advanced through the endocervical canal into the endometrial cavity. The tubal ostia were noted bilaterally. Additional findings included two anterior wall lesion  c/w polyps.  Using the [facility] lite device, these were removed.  Then the hysteroscope was removed. A #1 toothed curette was used to curette the cavity until rough gritty texture was noted in all quadrants. With revisualization of the hysteroscope, there was no longer any abnormal findings.  At this point no other procedure was needed and this procedure was ended. The hysteroscope was removed. The fluid deficit was 95 cc. The tenaculum was removed from the anterior lip of the cervix. The speculum was removed from the vagina. The prep was cleansed of the patient's skin. The legs are positioned back in the supine position. Sponge, lap, needle, initially counts were correct x2. Patient was taken to recovery in stable condition. ? ?COUNTS:  YES ? ?PLAN OF CARE: Transfer to PACU ? ? ? ? ? ? ? ? ? ? ? ? ?  ? ? ?

## 2021-07-09 NOTE — H&P (Signed)
Lori Luna is an 56 y.o. female G3P3 MWF with abnormal appearing endometrium with hx of PMP bleeding here for hysteroscopy with possible polyp resection, D&C.  Pt has undergone negative endometrial biopsy.  Procedure, risks and benefits reviewed in the office.  Pt is here and ready to proceed.   ? ?Pertinent Gynecological History: ?Menses: post-menopausal ?Bleeding: post menopausal bleeding ?Contraception: post menopausal status ?DES exposure: denies ?Blood transfusions: none ?Sexually transmitted diseases: no past history ?Last mammogram: normal Date: 08/19/2020 ?Last pap: normal Date: 07/02/2021 ?OB History: G3, P3  ? ?Menstrual History: ?Patient's last menstrual period was 10/19/2018 (exact date). ?  ? ?Past Medical History:  ?Diagnosis Date  ? Acoustic neuroma ([place]) 2023  ? followed by Dr. Harrison Mons, [place]  ? Anxiety   ? Atypical chest pain 06/19/2016  ? Back pain   ? Brain fog   ? DDD (degenerative disc disease), lumbar   ? Essential hypertension 06/19/2016  ? Family history of degenerative disc disease   ? Gallbladder problem   ? Hyperlipidemia 06/19/2016  ? Intussusception intestine ([place])   ? LUQ, saw Dr. Rosendo Gros, [place]  ? Joint pain   ? OSA (obstructive sleep apnea) 08/13/2020  ? PONV (postoperative nausea and vomiting)   ? Trouble in sleeping   ? Vitamin D deficiency   ? Weight gain   ? ? ?Past Surgical History:  ?Procedure Laterality Date  ? BREAST BIOPSY Left 08/07/2013  ? sclerosing adenosis   ? BREAST BIOPSY Left 08/04/2013  ? fibroadenoma.  ? GALLBLADDER SURGERY    ? ? ?Family History  ?Problem Relation Age of Onset  ? Hypertension Mother   ? Anxiety disorder Mother   ? Hypertension Father   ? CAD Father   ? Psoriasis Father   ? Hyperlipidemia Father   ? Cancer Father   ? Diabetes Maternal Grandmother   ? CAD Maternal Grandmother   ? Cancer Paternal Grandmother   ? Cancer Paternal Grandfather   ? Stroke Maternal Aunt   ? Healthy Brother   ? Healthy Son   ? Healthy Son   ? Autoimmune disease Son    ? Psoriasis Son   ? Vitiligo Son   ? Breast cancer Neg Hx   ? ? ?Social History:  reports that she has never smoked. She has never used smokeless tobacco. She reports current alcohol use of about 2.0 standard drinks per week. She reports that she does not use drugs. ? ?Allergies:  ?Allergies  ?Allergen Reactions  ? Amoxicillin-Pot Clavulanate Diarrhea and Nausea And Vomiting  ? ? ?Medications Prior to Admission  ?Medication Sig Dispense Refill Last Dose  ? amLODipine (NORVASC) 5 MG tablet TAKE 1 TABLET (5 MG TOTAL) BY MOUTH DAILY. 90 tablet 3 07/09/2021 at 0445  ? ASPIRIN 81 PO Take 1 tablet by mouth daily.   Past Week  ? betamethasone dipropionate 0.05 % cream Apply 1 application topically 2 (two) times daily.   Past Month  ? ibuprofen (ADVIL) 800 MG tablet Take 800 mg by mouth every 8 (eight) hours as needed for mild pain or moderate pain.   07/08/2021  ? loratadine (CLARITIN) 10 MG tablet Take 10 mg by mouth daily as needed for allergies.   Past Week  ? methocarbamol (ROBAXIN) 500 MG tablet Take 500 mg by mouth every 6 (six) hours as needed for muscle spasms.   Past Month  ? metroNIDAZOLE (METROGEL) 0.75 % vaginal gel Place 1 Applicatorful vaginally at bedtime. Use for 5 nights. 70 g 0 07/08/2021  ?  NONFORMULARY OR COMPOUNDED ITEM Vitamin E vaginal suppositories 200u/ml.  One pv three times weekly. 36 each 3 07/07/2021  ? olmesartan-hydrochlorothiazide (BENICAR HCT) 40-12.5 MG tablet Take 1 tablet by mouth daily. 90 tablet 3 07/08/2021  ? Potassium 99 MG TABS Take 99 mg by mouth daily.   Past Month  ? Rhubarb (ESTROVEN COMPLETE) 4 MG TABS Take 4 mg by mouth daily.   Past Week  ? tretinoin (RETIN-A) 0.025 % cream Apply 1 application topically daily.   Past Week  ? Cholecalciferol (VITAMIN D) 50 MCG (2000 UT) CAPS Take 2,000 Units by mouth daily.   More than a month  ? traMADol (ULTRAM) 50 MG tablet Take 50 mg by mouth every 6 (six) hours as needed for pain. (Patient not taking: Reported on 07/02/2021)   More than a  month  ? ? ?Review of Systems  ?All other systems reviewed and are negative. ? ?Blood pressure (!) 132/95, pulse 84, temperature 98.5 ?F (36.9 ?C), temperature source Oral, resp. rate 20, height '5\' 6"'$  (1.676 m), weight 104.1 kg, last menstrual period 10/19/2018, SpO2 98 %. ?Physical Exam ?Cardiovascular:  ?   Rate and Rhythm: Normal rate and regular rhythm.  ?Pulmonary:  ?   Effort: Pulmonary effort is normal.  ?   Breath sounds: Normal breath sounds.  ?Neurological:  ?   General: No focal deficit present.  ?   Mental Status: She is alert.  ?Psychiatric:     ?   Mood and Affect: Mood normal.  ? ? ?No results found for this or any previous visit (from the past 24 hour(s)). ? ?No results found. ? ?Assessment/Plan: ?56 yo G3P3 MWF with PMP bleeding here for hysteroscopy with possible polyp resection, D&C.  Questions answered.  Pt ready to proceed. ? ?Megan Salon ?07/09/2021, 7:16 AM ? ?

## 2021-07-09 NOTE — Anesthesia Postprocedure Evaluation (Signed)
Anesthesia Post Note ? ?Patient: Lori Luna ? ?Procedure(s) Performed: DILATATION AND CURETTAGE /HYSTEROSCOPY/myosure (Uterus) ? ?  ? ?Patient location during evaluation: PACU ?Anesthesia Type: General ?Level of consciousness: awake ?Pain management: pain level controlled ?Vital Signs Assessment: post-procedure vital signs reviewed and stable ?Respiratory status: spontaneous breathing and respiratory function stable ?Cardiovascular status: stable ?Postop Assessment: no apparent nausea or vomiting ?Anesthetic complications: no ? ? ?No notable events documented. ? ?Last Vitals:  ?Vitals:  ? 07/09/21 0845 07/09/21 0900  ?BP: 116/79 126/77  ?Pulse: 80 73  ?Resp: 16 14  ?Temp:  (!) 36.4 ?C  ?SpO2: 98% 100%  ?  ?Last Pain:  ?Vitals:  ? 07/09/21 0900  ?TempSrc: Oral  ?PainSc:   ? ? ?  ?  ?  ?  ?  ?  ? ?Merlinda Frederick ? ? ? ? ?

## 2021-07-09 NOTE — Anesthesia Procedure Notes (Signed)
Procedure Name: LMA Insertion ?Date/Time: 07/09/2021 7:34 AM ?Performed by: Bonney Aid, CRNA ?Pre-anesthesia Checklist: Patient identified, Emergency Drugs available, Suction available and Patient being monitored ?Patient Re-evaluated:Patient Re-evaluated prior to induction ?Oxygen Delivery Method: Circle system utilized ?Preoxygenation: Pre-oxygenation with 100% oxygen ?Induction Type: IV induction ?Ventilation: Mask ventilation without difficulty ?LMA: LMA inserted ?LMA Size: 4.0 ?Number of attempts: 1 ?Airway Equipment and Method: Bite block ?Placement Confirmation: positive ETCO2 ?Dental Injury: Teeth and Oropharynx as per pre-operative assessment  ? ? ? ? ?

## 2021-07-09 NOTE — Discharge Instructions (Addendum)
Post-surgical Instructions, Outpatient Surgery ? ?You may expect to feel dizzy, weak, and drowsy for as long as 24 hours after receiving the medicine that made you sleep (anesthetic). For the first 24 hours after your surgery:   ?Do not drive a car, ride a bicycle, participate in physical activities, or take public transportation until you are done taking narcotic pain medicines or as directed by Dr. Sabra Heck.  ?Do not drink alcohol or take tranquilizers.  ?Do not take medicine that has not been prescribed by your physicians.  ?Do not sign important papers or make important decisions while on narcotic pain medicines.  ?Have a responsible person with you.  ? ?PAIN MANAGEMENT ?Motrin '800mg'$ .  (This is the same as 4-'200mg'$  over the counter tablets of Motrin or ibuprofen.)  You may take this every eight hours or as needed for cramping.   ?Tramadol '50mg'$  ever 6 hours as needed for pain.  Use this is needed in addition to the motrin. ? ?DO'S AND DON'T'S ?Do not take a tub bath for one week.  You may shower on the first day after your surgery ?Do not do any heavy lifting for one to two weeks.  This increases the chance of bleeding. ?Do move around as you feel able.  Stairs are fine.  You may begin to exercise again as you feel able.  Do not lift any weights for two weeks. ?Do not put anything in the vagina for two weeks--no tampons, intercourse, or douching.   ? ?REGULAR MEDIATIONS/VITAMINS: ?You may restart all of your regular medications as prescribed. ?You may restart all of your vitamins as you normally take them.   ? ?PLEASE CALL OR SEEK MEDICAL CARE IF: ?You have persistent nausea and vomiting.  ?You have trouble eating or drinking.  ?You have an oral temperature above 100.5.  ?You have constipation that is not helped by adjusting diet or increasing fluid intake. Pain medicines are a common cause of constipation.  ?You have heavy vaginal bleeding ? ?Post Anesthesia Home Care Instructions ? ?Activity: ?Get plenty of rest  for the remainder of the day. A responsible individual must stay with you for 24 hours following the procedure.  ?For the next 24 hours, DO NOT: ?-Drive a car ?-Paediatric nurse ?-Drink alcoholic beverages ?-Take any medication unless instructed by your physician ?-Make any legal decisions or sign important papers. ? ?Meals: ?Start with liquid foods such as gelatin or soup. Progress to regular foods as tolerated. Avoid greasy, spicy, heavy foods. If nausea and/or vomiting occur, drink only clear liquids until the nausea and/or vomiting subsides. Call your physician if vomiting continues. ? ?Special Instructions/Symptoms: ?Your throat may feel dry or sore from the anesthesia or the breathing tube placed in your throat during surgery. If this causes discomfort, gargle with warm salt water. The discomfort should disappear within 24 hours. ? ?If you had a scopolamine patch placed behind your ear for the management of post- operative nausea and/or vomiting: ? ?1. The medication in the patch is effective for 72 hours, after which it should be removed.  Wrap patch in a tissue and discard in the trash. Wash hands thoroughly with soap and water. ?2. You may remove the patch earlier than 72 hours if you experience unpleasant side effects which may include dry mouth, dizziness or visual disturbances. ?3. Avoid touching the patch. Wash your hands with soap and water after contact with the patch. ?    ?

## 2021-07-09 NOTE — Transfer of Care (Signed)
Immediate Anesthesia Transfer of Care Note ? ?Patient: Lori Luna ? ?Procedure(s) Performed: DILATATION AND CURETTAGE /HYSTEROSCOPY/myosure (Uterus) ? ?Patient Location: PACU ? ?Anesthesia Type:General ? ?Level of Consciousness: drowsy ? ?Airway & Oxygen Therapy: Patient Spontanous Breathing and Patient connected to nasal cannula oxygen ? ?Post-op Assessment: Report given to RN ? ?Post vital signs: Reviewed and stable ? ?Last Vitals:  ?Vitals Value Taken Time  ?BP 128/80 07/09/21 0812  ?Temp    ?Pulse 102 07/09/21 0812  ?Resp 14 07/09/21 0812  ?SpO2 95 % 07/09/21 0812  ?Vitals shown include unvalidated device data. ? ?Last Pain:  ?Vitals:  ? 07/09/21 0619  ?TempSrc: Oral  ?PainSc: 0-No pain  ?   ? ?Patients Stated Pain Goal: 5 (07/09/21 6945) ? ?Complications: No notable events documented. ?

## 2021-07-10 ENCOUNTER — Encounter (HOSPITAL_BASED_OUTPATIENT_CLINIC_OR_DEPARTMENT_OTHER): Payer: Self-pay | Admitting: Obstetrics & Gynecology

## 2021-07-10 ENCOUNTER — Telehealth (HOSPITAL_BASED_OUTPATIENT_CLINIC_OR_DEPARTMENT_OTHER): Payer: Self-pay | Admitting: *Deleted

## 2021-07-10 LAB — SURGICAL PATHOLOGY

## 2021-07-10 NOTE — Telephone Encounter (Signed)
Below mychart message sent to Dr Oval Linsey ? ?Good afternoon, ?I was wondering if you could take a look at my recent test done for pre-opp with Dr Imogene Burn. I see the word abnormal so I?m curious of your opinion.  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 Base skull Surgery or radiation are being considered at this time so if we need to act of anything abnormal I would like to request we push up our appt.  ?  ?Thank you ?Lori Luna ?[phone] ? ?Will forward to Dr Oval Linsey for review  ?

## 2021-07-15 NOTE — Telephone Encounter (Signed)
Replied to mychart message.

## 2021-07-24 DIAGNOSIS — D333 Benign neoplasm of cranial nerves: Secondary | ICD-10-CM | POA: Diagnosis not present

## 2021-08-01 DIAGNOSIS — H9042 Sensorineural hearing loss, unilateral, left ear, with unrestricted hearing on the contralateral side: Secondary | ICD-10-CM | POA: Diagnosis not present

## 2021-08-01 DIAGNOSIS — H903 Sensorineural hearing loss, bilateral: Secondary | ICD-10-CM | POA: Diagnosis not present

## 2021-08-01 DIAGNOSIS — D333 Benign neoplasm of cranial nerves: Secondary | ICD-10-CM | POA: Diagnosis not present

## 2021-08-12 ENCOUNTER — Ambulatory Visit (INDEPENDENT_AMBULATORY_CARE_PROVIDER_SITE_OTHER): Payer: BC Managed Care – PPO | Admitting: Obstetrics & Gynecology

## 2021-08-12 ENCOUNTER — Encounter (HOSPITAL_BASED_OUTPATIENT_CLINIC_OR_DEPARTMENT_OTHER): Payer: Self-pay | Admitting: Obstetrics & Gynecology

## 2021-08-12 VITALS — BP 128/81 | HR 86 | Ht 66.0 in | Wt 227.8 lb

## 2021-08-12 DIAGNOSIS — Z1231 Encounter for screening mammogram for malignant neoplasm of breast: Secondary | ICD-10-CM

## 2021-08-12 DIAGNOSIS — Z9889 Other specified postprocedural states: Secondary | ICD-10-CM | POA: Diagnosis not present

## 2021-08-15 NOTE — Progress Notes (Signed)
GYNECOLOGY  VISIT ? ?CC:   post op recheck ? ?HPI: ?56 y.o. [id number] Married White or Caucasian female here for recheck after undergoing hysteroscopy on 07/09/2021.  She reports bleeding is none.  She has no pain.  Bowel function is Normal.  Bladder function is normal.   ? ?Pathology reviewed:  Yes .  Questions answered.   ? ?MEDS:   ?Current Outpatient Medications on File Prior to Visit  ?Medication Sig Dispense Refill  ? amLODipine (NORVASC) 5 MG tablet TAKE 1 TABLET (5 MG TOTAL) BY MOUTH DAILY. 90 tablet 3  ? ASPIRIN 81 PO Take 1 tablet by mouth daily.    ? Cholecalciferol (VITAMIN D) 50 MCG (2000 UT) CAPS Take 2,000 Units by mouth daily.    ? ibuprofen (ADVIL) 800 MG tablet Take 1 tablet (800 mg total) by mouth every 8 (eight) hours as needed. 30 tablet 0  ? loratadine (CLARITIN) 10 MG tablet Take 10 mg by mouth daily as needed for allergies.    ? methocarbamol (ROBAXIN) 500 MG tablet Take 500 mg by mouth every 6 (six) hours as needed for muscle spasms.    ? NONFORMULARY OR COMPOUNDED ITEM Vitamin E vaginal suppositories 200u/ml.  One pv three times weekly. 36 each 3  ? olmesartan-hydrochlorothiazide (BENICAR HCT) 40-12.5 MG tablet Take 1 tablet by mouth daily. 90 tablet 3  ? Rhubarb (ESTROVEN COMPLETE) 4 MG TABS Take 4 mg by mouth daily.    ? traMADol (ULTRAM) 50 MG tablet Take 1 tablet (50 mg total) by mouth every 6 (six) hours as needed. 20 tablet 0  ? tretinoin (RETIN-A) 0.025 % cream Apply 1 application topically daily.    ? betamethasone dipropionate 0.05 % cream Apply 1 application topically 2 (two) times daily. (Patient not taking: Reported on 08/12/2021)    ? Potassium 99 MG TABS Take 99 mg by mouth daily. (Patient not taking: Reported on 08/12/2021)    ? ?No current facility-administered medications on file prior to visit.  ? ? ?SH:  Smoking No  ? ? ?PHYSICAL EXAMINATION:   ? ?BP 128/81 (BP Location: Right Arm, Patient Position: Sitting, Cuff Size: Large)   Pulse 86   Ht '5\' 6"'$  (1.676 m) Comment: Reported   Wt 227 lb 12.8 oz (103.3 kg)   LMP 10/19/2018 (Exact Date)   BMI 36.77 kg/m?     ?General appearance: alert, cooperative and appears stated age ? ?Assessment/Plan: ?1. Post-operative state ?- doing well.  Will plan to see pt in about a year ? ?2. History of hysteroscopy ? ?3. Encounter for screening mammogram for malignant neoplasm of breast ?- MM 3D SCREEN BREAST BILATERAL; Future ? ? ? ? ?

## 2021-08-20 DIAGNOSIS — D333 Benign neoplasm of cranial nerves: Secondary | ICD-10-CM | POA: Diagnosis not present

## 2021-08-20 DIAGNOSIS — H9042 Sensorineural hearing loss, unilateral, left ear, with unrestricted hearing on the contralateral side: Secondary | ICD-10-CM | POA: Diagnosis not present

## 2021-08-20 DIAGNOSIS — Z0289 Encounter for other administrative examinations: Secondary | ICD-10-CM

## 2021-08-22 ENCOUNTER — Encounter (HOSPITAL_BASED_OUTPATIENT_CLINIC_OR_DEPARTMENT_OTHER): Payer: Self-pay | Admitting: Radiology

## 2021-08-22 ENCOUNTER — Ambulatory Visit (HOSPITAL_BASED_OUTPATIENT_CLINIC_OR_DEPARTMENT_OTHER)
Admission: RE | Admit: 2021-08-22 | Discharge: 2021-08-22 | Disposition: A | Discharge status: Home / Self Care | Payer: BC Managed Care – PPO | Source: Ambulatory Visit | Attending: Obstetrics & Gynecology | Admitting: Obstetrics & Gynecology

## 2021-08-22 DIAGNOSIS — Z1231 Encounter for screening mammogram for malignant neoplasm of breast: Secondary | ICD-10-CM | POA: Diagnosis not present

## 2021-08-25 ENCOUNTER — Other Ambulatory Visit: Payer: Self-pay | Admitting: Obstetrics & Gynecology

## 2021-08-25 ENCOUNTER — Other Ambulatory Visit: Payer: Self-pay | Admitting: Nurse Practitioner

## 2021-08-25 DIAGNOSIS — E785 Hyperlipidemia, unspecified: Secondary | ICD-10-CM | POA: Diagnosis not present

## 2021-08-25 DIAGNOSIS — I10 Essential (primary) hypertension: Secondary | ICD-10-CM | POA: Diagnosis not present

## 2021-08-25 DIAGNOSIS — Z131 Encounter for screening for diabetes mellitus: Secondary | ICD-10-CM | POA: Diagnosis not present

## 2021-08-25 DIAGNOSIS — E559 Vitamin D deficiency, unspecified: Secondary | ICD-10-CM | POA: Diagnosis not present

## 2021-08-25 DIAGNOSIS — Z79899 Other long term (current) drug therapy: Secondary | ICD-10-CM | POA: Diagnosis not present

## 2021-08-25 DIAGNOSIS — Z1159 Encounter for screening for other viral diseases: Secondary | ICD-10-CM | POA: Diagnosis not present

## 2021-08-25 DIAGNOSIS — R928 Other abnormal and inconclusive findings on diagnostic imaging of breast: Secondary | ICD-10-CM

## 2021-08-25 DIAGNOSIS — Z23 Encounter for immunization: Secondary | ICD-10-CM | POA: Diagnosis not present

## 2021-08-25 DIAGNOSIS — Z0000 Encounter for general adult medical examination without abnormal findings: Secondary | ICD-10-CM | POA: Diagnosis not present

## 2021-09-04 ENCOUNTER — Ambulatory Visit: Payer: BC Managed Care – PPO

## 2021-09-04 ENCOUNTER — Ambulatory Visit
Admission: RE | Admit: 2021-09-04 | Discharge: 2021-09-04 | Disposition: A | Discharge status: Home / Self Care | Payer: BC Managed Care – PPO | Source: Ambulatory Visit | Attending: Obstetrics & Gynecology | Admitting: Obstetrics & Gynecology

## 2021-09-04 DIAGNOSIS — R922 Inconclusive mammogram: Secondary | ICD-10-CM | POA: Diagnosis not present

## 2021-09-04 DIAGNOSIS — R928 Other abnormal and inconclusive findings on diagnostic imaging of breast: Secondary | ICD-10-CM

## 2021-09-10 ENCOUNTER — Other Ambulatory Visit (HOSPITAL_COMMUNITY)
Admission: RE | Admit: 2021-09-10 | Discharge: 2021-09-10 | Disposition: A | Discharge status: Home / Self Care | Payer: BC Managed Care – PPO | Source: Ambulatory Visit | Attending: Obstetrics & Gynecology | Admitting: Obstetrics & Gynecology

## 2021-09-10 ENCOUNTER — Ambulatory Visit (INDEPENDENT_AMBULATORY_CARE_PROVIDER_SITE_OTHER): Payer: BC Managed Care – PPO

## 2021-09-10 DIAGNOSIS — R300 Dysuria: Secondary | ICD-10-CM | POA: Diagnosis not present

## 2021-09-10 DIAGNOSIS — B9689 Other specified bacterial agents as the cause of diseases classified elsewhere: Secondary | ICD-10-CM | POA: Diagnosis not present

## 2021-09-10 DIAGNOSIS — N898 Other specified noninflammatory disorders of vagina: Secondary | ICD-10-CM | POA: Insufficient documentation

## 2021-09-10 DIAGNOSIS — N760 Acute vaginitis: Secondary | ICD-10-CM | POA: Diagnosis not present

## 2021-09-10 LAB — POCT URINALYSIS DIPSTICK
Bilirubin, UA: NEGATIVE
Blood, UA: NEGATIVE
Glucose, UA: NEGATIVE
Ketones, UA: NEGATIVE
Leukocytes, UA: NEGATIVE
Nitrite, UA: NEGATIVE
Protein, UA: NEGATIVE
Spec Grav, UA: 1.02 (ref 1.010–1.025)
Urobilinogen, UA: 0.2 E.U./dL
pH, UA: 5 (ref 5.0–8.0)

## 2021-09-10 NOTE — Progress Notes (Signed)
Patient came in today to give a urine sample to be checked for UTI. She states that she is experiencing lower abdominal pain and some pain while urinating. She also wanted to do an aptima self swab while she was at it to see if the BV that she had previously has since then gone away. Urine was checked and sent off for culture. Aptima swab sent out for testing. tbw

## 2021-09-12 ENCOUNTER — Other Ambulatory Visit (HOSPITAL_BASED_OUTPATIENT_CLINIC_OR_DEPARTMENT_OTHER): Payer: Self-pay | Admitting: Obstetrics & Gynecology

## 2021-09-12 ENCOUNTER — Encounter (HOSPITAL_BASED_OUTPATIENT_CLINIC_OR_DEPARTMENT_OTHER): Payer: Self-pay | Admitting: Obstetrics & Gynecology

## 2021-09-12 LAB — CERVICOVAGINAL ANCILLARY ONLY
Bacterial Vaginitis (gardnerella): POSITIVE — AB
Candida Glabrata: NEGATIVE
Candida Vaginitis: NEGATIVE
Comment: NEGATIVE
Comment: NEGATIVE
Comment: NEGATIVE

## 2021-09-12 LAB — URINE CULTURE: Organism ID, Bacteria: NO GROWTH

## 2021-09-12 MED ORDER — METRONIDAZOLE 0.75 % VA GEL: 1.0000 | Freq: Every day | VAGINAL | 0 refills | Status: DC

## 2021-09-12 MED ORDER — FLUCONAZOLE 150 MG PO TABS: 150.0000 mg | ORAL_TABLET | Freq: Once | ORAL | 0 refills | Status: DC

## 2021-09-24 ENCOUNTER — Ambulatory Visit (INDEPENDENT_AMBULATORY_CARE_PROVIDER_SITE_OTHER): Payer: BC Managed Care – PPO | Admitting: Bariatrics

## 2021-09-25 ENCOUNTER — Ambulatory Visit (INDEPENDENT_AMBULATORY_CARE_PROVIDER_SITE_OTHER): Payer: BC Managed Care – PPO | Admitting: Cardiovascular Disease

## 2021-09-25 ENCOUNTER — Encounter (HOSPITAL_BASED_OUTPATIENT_CLINIC_OR_DEPARTMENT_OTHER): Payer: Self-pay | Admitting: Cardiovascular Disease

## 2021-09-25 VITALS — BP 114/84 | HR 80 | Ht 66.0 in | Wt 220.8 lb

## 2021-09-25 DIAGNOSIS — E7849 Other hyperlipidemia: Secondary | ICD-10-CM

## 2021-09-25 DIAGNOSIS — I10 Essential (primary) hypertension: Secondary | ICD-10-CM

## 2021-09-25 DIAGNOSIS — G4733 Obstructive sleep apnea (adult) (pediatric): Secondary | ICD-10-CM

## 2021-09-25 NOTE — Progress Notes (Signed)
Cardiology Office Note :   Date:  09/25/2021   ID:  Lori Luna, DOB [birth year], MRN [id number]  PCP:  Cari Caraway, MD  Cardiologist:   Skeet Latch, MD   No chief complaint on file.   History of Present Illness: Lori Luna is a 56 y.o. female with hypertension and hyperlipidemia who presents for follow-up.  Ms. Lori Luna was initially seen 06/2016 for cardiovascular risk assessment.  She has a family history of CAD and had some episodes of atypical chest pain.  She was referred for coronary CT-a 07/2016 that revealed a coronary calcium score of 0.    Her blood pressure was elevated and enalapril was switched to to olmesartan 20 mg.  Prior to that change she had a cough that has since improved.    She was hospitalized for withdrawal from gabapentin.  Telmisartan was subsequently switched to telmisartan/HCTZ.  In February she had an episode of waking up with left chest and arm pain.  She described it as a burning pain that did not change with exertion, though she has been on it while unable to exert herself much lately due to an injury to her foot.  There is no associated shortness of breath and the pain was better with palpation of the chest and arm.  Lori Luna had chest pain that was thought to be due to cervical radiculopathy.  Her blood pressures were running high at home but were controlled in the office.  She followed up with [name], NP on 12/2019 and was doing better.  Lori Luna had COVID-19 in early January 2021. At her last appointment, her blood pressure was above goal but she wanted to work on diet and exercise. Her pharmacist added amlodipine and she participated in [place].  Today, she is feeling good. She finished her program at [facility] last Thursday. She enjoys some weight training, but mainly exercises with the elliptical. There are no exertional chest pains or shortness of breath. Recently, she was diagnosed with sleep apnea, which she states  is affecting her weight loss because she is not sleeping well. Her blood pressure at home is usually around 120/70 in the morning and 130/80 in the afternoon. She does not report any LE edema.  She is continuing her weight loss plan with Dr. Leafy Ro and watching her diet. Her son participates in softball which helps her stay active. Her next weight goal is to lose another 10 pounds and then have bloodwork done to help her stay motivated.  She is going for acoustic neuroma surgery at the end of July.  She has lost the hearing in her left ear and only has about 15% of her balance.  She is concerned about her cardiac status going into surgery.  She has been exercising and losing weight.  She is doing [place] and attributes this to the improvement in her cholesterol.  She has not had any chest pain or shortness of breath.  She has no lower extremity edema, orthopnea, or PND. She denies any palpitations, lightheadedness/dizziness.  Past Medical History:  Diagnosis Date   Acoustic neuroma [facility]) 2023   followed by Dr. Harrison Mons, [facility]   Anxiety    Atypical chest pain 06/19/2016   Back pain    Brain fog    DDD (degenerative disc disease), lumbar    Essential hypertension 06/19/2016   Family history of degenerative disc disease    Gallbladder problem    Hyperlipidemia 06/19/2016   Intussusception intestine ([place])  LUQ, saw Dr. Rosendo Gros, [facility]   Joint pain    OSA (obstructive sleep apnea) 08/13/2020   PONV (postoperative nausea and vomiting)    Trouble in sleeping    Vitamin D deficiency    Weight gain     Past Surgical History:  Procedure Laterality Date   BREAST BIOPSY Left 08/07/2013   sclerosing adenosis    BREAST BIOPSY Left 08/04/2013   fibroadenoma.   GALLBLADDER SURGERY     HYSTEROSCOPY WITH D & C N/A 07/09/2021   Procedure: DILATATION AND CURETTAGE /HYSTEROSCOPY/myosure;  Surgeon: Megan Salon, MD;  Location: [facility];  Service: Gynecology;  Laterality: N/A;      Current Outpatient Medications  Medication Sig Dispense Refill   amLODipine (NORVASC) 5 MG tablet TAKE 1 TABLET (5 MG TOTAL) BY MOUTH DAILY. 90 tablet 3   ASPIRIN 81 PO Take 1 tablet by mouth daily.     betamethasone dipropionate 0.05 % cream Apply 1 application. topically as needed.     ibuprofen (ADVIL) 800 MG tablet Take 1 tablet (800 mg total) by mouth every 8 (eight) hours as needed. 30 tablet 0   loratadine (CLARITIN) 10 MG tablet Take 10 mg by mouth daily as needed for allergies.     methocarbamol (ROBAXIN) 500 MG tablet Take 500 mg by mouth every 6 (six) hours as needed for muscle spasms.     olmesartan-hydrochlorothiazide (BENICAR HCT) 40-12.5 MG tablet Take 1 tablet by mouth daily. 90 tablet 3   Rhubarb (ESTROVEN COMPLETE) 4 MG TABS Take 4 mg by mouth daily.     traMADol (ULTRAM) 50 MG tablet Take 1 tablet (50 mg total) by mouth every 6 (six) hours as needed. 20 tablet 0   tretinoin (RETIN-A) 0.025 % cream Apply 1 application topically daily. (Patient not taking: Reported on 09/25/2021)     No current facility-administered medications for this visit.    Allergies:   Amoxicillin-pot clavulanate    Social History:  The patient  reports that she has never smoked. She has never used smokeless tobacco. She reports current alcohol use of about 2.0 standard drinks of alcohol per week. She reports that she does not use drugs.   Family History:  The patient's family history includes Anxiety disorder in her mother; Autoimmune disease in her son; CAD in her father and maternal grandmother; Cancer in her father, paternal grandfather, and paternal grandmother; Diabetes in her maternal grandmother; Healthy in her brother, son, and son; Hyperlipidemia in her father; Hypertension in her father and mother; Psoriasis in her father and son; Stroke in her maternal aunt; Vitiligo in her son.    ROS:  Please see the history of present illness.   Otherwise, review of systems are positive for none.     All other systems are reviewed and negative.    PHYSICAL EXAM: VS:  BP 114/84 (BP Location: Left Arm, Patient Position: Sitting, Cuff Size: Large)   Pulse 80   Ht '5\' 6"'$  (1.676 m)   Wt 220 lb 12.8 oz (100.2 kg)   LMP 10/19/2018 (Exact Date)   SpO2 95%   BMI 35.64 kg/m  , BMI Body mass index is 35.64 kg/m. GENERAL:  Well appearing HEENT: Pupils equal round and reactive, fundi not visualized, oral mucosa unremarkable NECK:  No jugular venous distention, waveform within normal limits, carotid upstroke brisk and symmetric, no bruits LUNGS:  Clear to auscultation bilaterally HEART:  RRR.  PMI not displaced or sustained,S1 and S2 within normal limits, no S3, no  S4, no clicks, no rubs, no murmurs ABD:  Flat, positive bowel sounds normal in frequency in pitch, no bruits, no rebound, no guarding, no midline pulsatile mass, no hepatomegaly, no splenomegaly EXT:  2 plus pulses throughout, no edema, no cyanosis no clubbing.  L foot Cam walker boot SKIN:  No rashes no nodules NEURO:  Cranial nerves II through XII grossly intact, motor grossly intact throughout PSYCH:  Cognitively intact, oriented to person place and time  EKG:   The ekg ordered 06/19/16 demonstrates sinus rhythm rate 76 bpm.  08/19/2017: Sinus rhythm.  Rate 77 bpm. 05/13/2020: Sinus rhythm.  Rate 70 bpm. 08/13/2020: EKG is not ordered today.  Coronary CT-A 07/29/16: 1. Coronary calcium score of 0. This was 0 percentile for age and sex matched control.   2. Normal coronary origin with right dominance.   3. No evidence of CAD.  Recent Labs: 07/07/2021: BUN 17; Creatinine, Ser 0.62; Hemoglobin 13.8; Platelets 283; Potassium 3.7; Sodium 139    Lipid Panel No results found for: "CHOL", "TRIG", "HDL", "CHOLHDL", "VLDL", "LDLCALC", "LDLDIRECT"   06/08/16: Total cholesterol 220, triglycerides 175, HDL 56, LDL 129 Sodium 141, potassium 3.8, BUN 17, creatinine 0.1 AST 16, ALT 24  08/02/2018: Total cholesterol 210, triglycerides  166, HDL 49, LDL 129  Wt Readings from Last 3 Encounters:  09/25/21 220 lb 12.8 oz (100.2 kg)  08/12/21 227 lb 12.8 oz (103.3 kg)  07/09/21 229 lb 9.6 oz (104.1 kg)      ASSESSMENT AND PLAN: No problem-specific Assessment & Plan notes found for this encounter. Essential hypertension Blood pressure has been well controlled on amlodipine, olmesartan, and HCTZ.  Continue working on exercise and diet.   OSA (obstructive sleep apnea) Continue CPAP.  Class 2 severe obesity with serious comorbidity and body mass index (BMI) of 37.0 to 37.9 in adult [facility]) She is doing a good job of exercising and completed the prep program.  She is doing well with the [place].  She was encouraged to keep that up.   Hyperlipidemia Lipids have been improving and her total cholesterol is below 200 for the first time that she can remember.  She was congratulated on these efforts.  Presurgical risk: Surgical risk is low.  She has no underlying cardiovascular disease and is able to achieve >METS of physical activity.  No ischemic evaluation necessary.  Current medicines are reviewed at length with the patient today.  The patient does not have concerns regarding medicines.  The following changes have been made: None  Labs/ tests ordered today include:   No orders of the defined types were placed in this encounter.    Disposition:   FU with Sheronica Corey C. Oval Linsey, MD, [facility] in 1 year.    Signed, Kirsti Mcalpine C. Oval Linsey, MD, [facility]  09/25/2021 9:54 AM    [place]

## 2021-09-25 NOTE — Patient Instructions (Signed)
Medication Instructions:  ?Your physician recommends that you continue on your current medications as directed. Please refer to the Current Medication list given to you today.  ? ?*If you need a refill on your cardiac medications before your next appointment, please call your pharmacy* ? ?Lab Work: ?NONE ? ?Testing/Procedures: ?NONE ? ?Follow-Up: ?At [facility], you and your health needs are our priority.  As part of our continuing mission to provide you with exceptional heart care, we have created designated Provider Care Teams.  These Care Teams include your primary Cardiologist (physician) and Advanced Practice Providers (APPs -  Physician Assistants and Nurse Practitioners) who all work together to provide you with the care you need, when you need it. ? ?We recommend signing up for the patient portal called "MyChart".  Sign up information is provided on this After Visit Summary.  MyChart is used to connect with patients for Virtual Visits (Telemedicine).  Patients are able to view lab/test results, encounter notes, upcoming appointments, etc.  Non-urgent messages can be sent to your provider as well.   ?To learn more about what you can do with MyChart, go to [email or web address].   ? ?Your next appointment:   ?12 month(s) ? ?The format for your next appointment:   ?In Person ? ?Provider:   ?Tiffany Keene, MD  ? ? ? ? ? ? ?

## 2021-10-08 ENCOUNTER — Other Ambulatory Visit (HOSPITAL_COMMUNITY)
Admission: RE | Admit: 2021-10-08 | Discharge: 2021-10-08 | Disposition: A | Discharge status: Home / Self Care | Payer: BC Managed Care – PPO | Source: Ambulatory Visit | Attending: Obstetrics & Gynecology | Admitting: Obstetrics & Gynecology

## 2021-10-08 ENCOUNTER — Ambulatory Visit (INDEPENDENT_AMBULATORY_CARE_PROVIDER_SITE_OTHER): Payer: Self-pay | Admitting: Bariatrics

## 2021-10-08 ENCOUNTER — Ambulatory Visit (INDEPENDENT_AMBULATORY_CARE_PROVIDER_SITE_OTHER): Payer: BC Managed Care – PPO | Admitting: *Deleted

## 2021-10-08 DIAGNOSIS — N898 Other specified noninflammatory disorders of vagina: Secondary | ICD-10-CM

## 2021-10-08 DIAGNOSIS — N760 Acute vaginitis: Secondary | ICD-10-CM | POA: Insufficient documentation

## 2021-10-08 DIAGNOSIS — B3731 Acute candidiasis of vulva and vagina: Secondary | ICD-10-CM | POA: Insufficient documentation

## 2021-10-08 NOTE — Progress Notes (Signed)
Pt here with complaints of vaginal discharge. Has recently been treated for BV. Pt instructed on performing self swab. Aptima swab obtained by self swab. Advised that we would call with results and recommendations once complete.

## 2021-10-09 ENCOUNTER — Other Ambulatory Visit (HOSPITAL_BASED_OUTPATIENT_CLINIC_OR_DEPARTMENT_OTHER): Payer: Self-pay | Admitting: Obstetrics & Gynecology

## 2021-10-09 LAB — CERVICOVAGINAL ANCILLARY ONLY
Bacterial Vaginitis (gardnerella): POSITIVE — AB
Candida Glabrata: NEGATIVE
Candida Vaginitis: POSITIVE — AB
Comment: NEGATIVE
Comment: NEGATIVE
Comment: NEGATIVE

## 2021-10-09 MED ORDER — FLUCONAZOLE 150 MG PO TABS: 150.0000 mg | ORAL_TABLET | Freq: Once | ORAL | 0 refills | Status: AC

## 2021-10-09 MED ORDER — METRONIDAZOLE 500 MG PO TABS: 500.0000 mg | ORAL_TABLET | Freq: Two times a day (BID) | ORAL | 0 refills | Status: DC

## 2021-10-09 NOTE — Telephone Encounter (Signed)
Patient was seen by Dr Oval Linsey 09/2021

## 2021-10-18 HISTORY — PX: ACOUSTIC NEUROMA RESECTION: SHX5713

## 2021-10-24 ENCOUNTER — Other Ambulatory Visit (HOSPITAL_COMMUNITY)
Admission: RE | Admit: 2021-10-24 | Discharge: 2021-10-24 | Disposition: A | Discharge status: Home / Self Care | Payer: BC Managed Care – PPO | Source: Ambulatory Visit | Attending: Obstetrics & Gynecology | Admitting: Obstetrics & Gynecology

## 2021-10-24 ENCOUNTER — Ambulatory Visit (HOSPITAL_BASED_OUTPATIENT_CLINIC_OR_DEPARTMENT_OTHER): Payer: BC Managed Care – PPO

## 2021-10-24 DIAGNOSIS — N898 Other specified noninflammatory disorders of vagina: Secondary | ICD-10-CM | POA: Insufficient documentation

## 2021-10-24 NOTE — Progress Notes (Signed)
Patient came in today to repeat aptima swab. Patient states she is having some vaginal discharge. tbw

## 2021-10-27 LAB — CERVICOVAGINAL ANCILLARY ONLY
Bacterial Vaginitis (gardnerella): NEGATIVE
Candida Glabrata: NEGATIVE
Candida Vaginitis: NEGATIVE
Comment: NEGATIVE
Comment: NEGATIVE
Comment: NEGATIVE

## 2021-10-30 ENCOUNTER — Encounter (HOSPITAL_BASED_OUTPATIENT_CLINIC_OR_DEPARTMENT_OTHER): Payer: Self-pay | Admitting: Obstetrics & Gynecology

## 2021-11-06 ENCOUNTER — Encounter (HOSPITAL_BASED_OUTPATIENT_CLINIC_OR_DEPARTMENT_OTHER): Payer: Self-pay | Admitting: Obstetrics & Gynecology

## 2021-11-06 ENCOUNTER — Other Ambulatory Visit (HOSPITAL_BASED_OUTPATIENT_CLINIC_OR_DEPARTMENT_OTHER): Payer: Self-pay | Admitting: Obstetrics & Gynecology

## 2021-11-06 MED ORDER — FLUCONAZOLE 150 MG PO TABS: 150.0000 mg | ORAL_TABLET | Freq: Once | ORAL | 0 refills | Status: AC

## 2021-11-06 MED ORDER — METRONIDAZOLE 500 MG PO TABS: 500.0000 mg | ORAL_TABLET | Freq: Two times a day (BID) | ORAL | 0 refills | Status: DC

## 2021-11-11 DIAGNOSIS — D333 Benign neoplasm of cranial nerves: Secondary | ICD-10-CM | POA: Diagnosis not present

## 2021-11-13 DIAGNOSIS — D333 Benign neoplasm of cranial nerves: Secondary | ICD-10-CM | POA: Diagnosis not present

## 2021-11-13 DIAGNOSIS — H9041 Sensorineural hearing loss, unilateral, right ear, with unrestricted hearing on the contralateral side: Secondary | ICD-10-CM | POA: Diagnosis not present

## 2021-11-16 DIAGNOSIS — E785 Hyperlipidemia, unspecified: Secondary | ICD-10-CM | POA: Diagnosis not present

## 2021-11-16 DIAGNOSIS — D333 Benign neoplasm of cranial nerves: Secondary | ICD-10-CM | POA: Diagnosis not present

## 2021-11-16 DIAGNOSIS — T380X5A Adverse effect of glucocorticoids and synthetic analogues, initial encounter: Secondary | ICD-10-CM | POA: Diagnosis not present

## 2021-11-16 DIAGNOSIS — G4733 Obstructive sleep apnea (adult) (pediatric): Secondary | ICD-10-CM | POA: Diagnosis not present

## 2021-11-16 DIAGNOSIS — Z4659 Encounter for fitting and adjustment of other gastrointestinal appliance and device: Secondary | ICD-10-CM | POA: Diagnosis not present

## 2021-11-16 DIAGNOSIS — Z9049 Acquired absence of other specified parts of digestive tract: Secondary | ICD-10-CM | POA: Diagnosis not present

## 2021-11-16 DIAGNOSIS — G4730 Sleep apnea, unspecified: Secondary | ICD-10-CM | POA: Diagnosis not present

## 2021-11-16 DIAGNOSIS — I10 Essential (primary) hypertension: Secondary | ICD-10-CM | POA: Diagnosis not present

## 2021-11-16 DIAGNOSIS — D62 Acute posthemorrhagic anemia: Secondary | ICD-10-CM | POA: Diagnosis not present

## 2021-11-16 DIAGNOSIS — Z880 Allergy status to penicillin: Secondary | ICD-10-CM | POA: Diagnosis not present

## 2021-11-16 DIAGNOSIS — R739 Hyperglycemia, unspecified: Secondary | ICD-10-CM | POA: Diagnosis not present

## 2021-11-16 DIAGNOSIS — D225 Melanocytic nevi of trunk: Secondary | ICD-10-CM | POA: Diagnosis not present

## 2021-11-16 DIAGNOSIS — G8918 Other acute postprocedural pain: Secondary | ICD-10-CM | POA: Diagnosis not present

## 2021-11-17 DIAGNOSIS — G4730 Sleep apnea, unspecified: Secondary | ICD-10-CM | POA: Diagnosis not present

## 2021-11-17 DIAGNOSIS — D333 Benign neoplasm of cranial nerves: Secondary | ICD-10-CM | POA: Diagnosis not present

## 2021-11-17 DIAGNOSIS — I10 Essential (primary) hypertension: Secondary | ICD-10-CM | POA: Diagnosis not present

## 2021-11-20 DIAGNOSIS — D225 Melanocytic nevi of trunk: Secondary | ICD-10-CM | POA: Diagnosis not present

## 2021-11-26 ENCOUNTER — Encounter (INDEPENDENT_AMBULATORY_CARE_PROVIDER_SITE_OTHER): Payer: Self-pay

## 2022-01-08 DIAGNOSIS — U071 COVID-19: Secondary | ICD-10-CM | POA: Diagnosis not present

## 2022-01-08 DIAGNOSIS — J029 Acute pharyngitis, unspecified: Secondary | ICD-10-CM | POA: Diagnosis not present

## 2022-01-09 DIAGNOSIS — U071 COVID-19: Secondary | ICD-10-CM | POA: Diagnosis not present

## 2022-01-15 DIAGNOSIS — M25562 Pain in left knee: Secondary | ICD-10-CM | POA: Diagnosis not present

## 2022-02-16 DIAGNOSIS — H9042 Sensorineural hearing loss, unilateral, left ear, with unrestricted hearing on the contralateral side: Secondary | ICD-10-CM | POA: Diagnosis not present

## 2022-02-16 DIAGNOSIS — D333 Benign neoplasm of cranial nerves: Secondary | ICD-10-CM | POA: Diagnosis not present

## 2022-05-08 ENCOUNTER — Other Ambulatory Visit: Payer: Self-pay | Admitting: Cardiovascular Disease

## 2022-05-08 NOTE — Telephone Encounter (Signed)
Rx request sent to pharmacy.  

## 2022-06-01 ENCOUNTER — Encounter (HOSPITAL_BASED_OUTPATIENT_CLINIC_OR_DEPARTMENT_OTHER): Payer: Self-pay | Admitting: Obstetrics & Gynecology

## 2022-06-02 DIAGNOSIS — E559 Vitamin D deficiency, unspecified: Secondary | ICD-10-CM | POA: Diagnosis not present

## 2022-06-02 DIAGNOSIS — R509 Fever, unspecified: Secondary | ICD-10-CM | POA: Diagnosis not present

## 2022-06-02 DIAGNOSIS — R350 Frequency of micturition: Secondary | ICD-10-CM | POA: Diagnosis not present

## 2022-06-02 DIAGNOSIS — L659 Nonscarring hair loss, unspecified: Secondary | ICD-10-CM | POA: Diagnosis not present

## 2022-06-02 DIAGNOSIS — M2550 Pain in unspecified joint: Secondary | ICD-10-CM | POA: Diagnosis not present

## 2022-06-02 DIAGNOSIS — N760 Acute vaginitis: Secondary | ICD-10-CM | POA: Diagnosis not present

## 2022-06-08 ENCOUNTER — Encounter: Payer: Self-pay | Admitting: *Deleted

## 2022-06-23 DIAGNOSIS — R1031 Right lower quadrant pain: Secondary | ICD-10-CM | POA: Diagnosis not present

## 2022-06-27 ENCOUNTER — Other Ambulatory Visit (HOSPITAL_BASED_OUTPATIENT_CLINIC_OR_DEPARTMENT_OTHER): Payer: Self-pay | Admitting: Cardiovascular Disease

## 2022-06-29 NOTE — Telephone Encounter (Signed)
Rx request sent to pharmacy.  

## 2022-08-19 DIAGNOSIS — M2550 Pain in unspecified joint: Secondary | ICD-10-CM | POA: Diagnosis not present

## 2022-08-19 DIAGNOSIS — E7800 Pure hypercholesterolemia, unspecified: Secondary | ICD-10-CM | POA: Diagnosis not present

## 2022-08-19 DIAGNOSIS — I10 Essential (primary) hypertension: Secondary | ICD-10-CM | POA: Diagnosis not present

## 2022-08-19 DIAGNOSIS — L659 Nonscarring hair loss, unspecified: Secondary | ICD-10-CM | POA: Diagnosis not present

## 2022-08-19 DIAGNOSIS — R5383 Other fatigue: Secondary | ICD-10-CM | POA: Diagnosis not present

## 2022-08-19 DIAGNOSIS — E559 Vitamin D deficiency, unspecified: Secondary | ICD-10-CM | POA: Diagnosis not present

## 2022-08-19 DIAGNOSIS — G4733 Obstructive sleep apnea (adult) (pediatric): Secondary | ICD-10-CM | POA: Diagnosis not present

## 2022-08-19 DIAGNOSIS — E538 Deficiency of other specified B group vitamins: Secondary | ICD-10-CM | POA: Diagnosis not present

## 2022-09-25 ENCOUNTER — Other Ambulatory Visit: Payer: Self-pay | Admitting: Family Medicine

## 2022-09-25 DIAGNOSIS — Z1231 Encounter for screening mammogram for malignant neoplasm of breast: Secondary | ICD-10-CM

## 2022-09-30 ENCOUNTER — Ambulatory Visit: Payer: BC Managed Care – PPO

## 2022-10-06 DIAGNOSIS — G4733 Obstructive sleep apnea (adult) (pediatric): Secondary | ICD-10-CM | POA: Diagnosis not present

## 2022-10-06 DIAGNOSIS — I10 Essential (primary) hypertension: Secondary | ICD-10-CM | POA: Diagnosis not present

## 2022-10-27 DIAGNOSIS — D2221 Melanocytic nevi of right ear and external auricular canal: Secondary | ICD-10-CM | POA: Diagnosis not present

## 2022-10-27 DIAGNOSIS — D239 Other benign neoplasm of skin, unspecified: Secondary | ICD-10-CM | POA: Diagnosis not present

## 2022-10-27 DIAGNOSIS — D225 Melanocytic nevi of trunk: Secondary | ICD-10-CM | POA: Diagnosis not present

## 2022-10-27 DIAGNOSIS — L578 Other skin changes due to chronic exposure to nonionizing radiation: Secondary | ICD-10-CM | POA: Diagnosis not present

## 2022-10-28 DIAGNOSIS — G4733 Obstructive sleep apnea (adult) (pediatric): Secondary | ICD-10-CM | POA: Diagnosis not present

## 2022-11-03 DIAGNOSIS — Z86010 Personal history of colonic polyps: Secondary | ICD-10-CM | POA: Diagnosis not present

## 2022-11-03 DIAGNOSIS — Z8371 Family history of adenomatous and serrated polyps: Secondary | ICD-10-CM | POA: Diagnosis not present

## 2022-11-03 DIAGNOSIS — Z09 Encounter for follow-up examination after completed treatment for conditions other than malignant neoplasm: Secondary | ICD-10-CM | POA: Diagnosis not present

## 2022-11-03 DIAGNOSIS — Z1211 Encounter for screening for malignant neoplasm of colon: Secondary | ICD-10-CM | POA: Diagnosis not present

## 2022-11-03 DIAGNOSIS — K5730 Diverticulosis of large intestine without perforation or abscess without bleeding: Secondary | ICD-10-CM | POA: Diagnosis not present

## 2022-11-12 DIAGNOSIS — L820 Inflamed seborrheic keratosis: Secondary | ICD-10-CM | POA: Diagnosis not present

## 2022-11-28 DIAGNOSIS — G4733 Obstructive sleep apnea (adult) (pediatric): Secondary | ICD-10-CM | POA: Diagnosis not present

## 2022-12-02 ENCOUNTER — Other Ambulatory Visit: Payer: Self-pay | Admitting: Otolaryngology

## 2022-12-02 DIAGNOSIS — D333 Benign neoplasm of cranial nerves: Secondary | ICD-10-CM

## 2022-12-14 ENCOUNTER — Other Ambulatory Visit: Payer: Self-pay | Admitting: Otolaryngology

## 2022-12-14 DIAGNOSIS — D333 Benign neoplasm of cranial nerves: Secondary | ICD-10-CM

## 2022-12-17 ENCOUNTER — Other Ambulatory Visit: Payer: Self-pay | Admitting: Cardiovascular Disease

## 2022-12-18 ENCOUNTER — Ambulatory Visit
Admission: RE | Admit: 2022-12-18 | Discharge: 2022-12-18 | Disposition: A | Discharge status: Home / Self Care | Payer: BC Managed Care – PPO | Source: Ambulatory Visit | Attending: Otolaryngology | Admitting: Otolaryngology

## 2022-12-18 DIAGNOSIS — D333 Benign neoplasm of cranial nerves: Secondary | ICD-10-CM | POA: Diagnosis not present

## 2022-12-18 MED ORDER — GADOPICLENOL 0.5 MMOL/ML IV SOLN
10.0000 mL | Freq: Once | INTRAVENOUS | Status: AC | PRN
  Administered 2022-12-18: 10 mL via INTRAVENOUS

## 2022-12-18 NOTE — Telephone Encounter (Signed)
Rx request sent to pharmacy.  

## 2022-12-24 DIAGNOSIS — H9042 Sensorineural hearing loss, unilateral, left ear, with unrestricted hearing on the contralateral side: Secondary | ICD-10-CM | POA: Diagnosis not present

## 2022-12-24 DIAGNOSIS — R42 Dizziness and giddiness: Secondary | ICD-10-CM | POA: Diagnosis not present

## 2022-12-29 DIAGNOSIS — G4733 Obstructive sleep apnea (adult) (pediatric): Secondary | ICD-10-CM | POA: Diagnosis not present

## 2023-01-04 ENCOUNTER — Ambulatory Visit
Admission: RE | Admit: 2023-01-04 | Discharge: 2023-01-04 | Disposition: A | Discharge status: Home / Self Care | Payer: BC Managed Care – PPO | Source: Ambulatory Visit | Attending: Family Medicine | Admitting: Family Medicine

## 2023-01-04 DIAGNOSIS — Z1231 Encounter for screening mammogram for malignant neoplasm of breast: Secondary | ICD-10-CM | POA: Diagnosis not present

## 2023-01-07 DIAGNOSIS — G4733 Obstructive sleep apnea (adult) (pediatric): Secondary | ICD-10-CM | POA: Diagnosis not present

## 2023-01-07 DIAGNOSIS — I10 Essential (primary) hypertension: Secondary | ICD-10-CM | POA: Diagnosis not present

## 2023-01-22 ENCOUNTER — Ambulatory Visit (HOSPITAL_BASED_OUTPATIENT_CLINIC_OR_DEPARTMENT_OTHER): Payer: BC Managed Care – PPO | Admitting: Cardiovascular Disease

## 2023-01-22 ENCOUNTER — Encounter (HOSPITAL_BASED_OUTPATIENT_CLINIC_OR_DEPARTMENT_OTHER): Payer: Self-pay | Admitting: Cardiovascular Disease

## 2023-01-22 VITALS — BP 124/82 | HR 85 | Ht 66.0 in | Wt 242.2 lb

## 2023-01-22 DIAGNOSIS — I10 Essential (primary) hypertension: Secondary | ICD-10-CM | POA: Diagnosis not present

## 2023-01-22 DIAGNOSIS — E785 Hyperlipidemia, unspecified: Secondary | ICD-10-CM

## 2023-01-22 DIAGNOSIS — Z6836 Body mass index (BMI) 36.0-36.9, adult: Secondary | ICD-10-CM

## 2023-01-22 DIAGNOSIS — E7800 Pure hypercholesterolemia, unspecified: Secondary | ICD-10-CM

## 2023-01-22 DIAGNOSIS — Z09 Encounter for follow-up examination after completed treatment for conditions other than malignant neoplasm: Secondary | ICD-10-CM

## 2023-01-22 DIAGNOSIS — G4733 Obstructive sleep apnea (adult) (pediatric): Secondary | ICD-10-CM | POA: Diagnosis not present

## 2023-01-22 MED ORDER — SPIRONOLACTONE 25 MG PO TABS: 25.0000 mg | ORAL_TABLET | Freq: Every day | ORAL | 3 refills | Status: DC

## 2023-01-22 NOTE — Progress Notes (Signed)
Cardiology Office Note:  .    Date:  01/22/2023  ID:  Lori Luna, DOB 24-Jul-1965, MRN [id number] PCP: Gweneth Dimitri, MD  [facility] Providers Cardiologist:  Chilton Si, MD     History of Present Illness: Lori Luna Kitchen    Lori Luna is a 57 y.o. female with hypertension and hyperlipidemia who presents for follow-up.  Lori Luna was initially seen 06/2016 for cardiovascular risk assessment.  Lori has a family history of CAD and had some episodes of atypical chest pain.  Lori was referred for coronary CT-a 07/2016 that revealed a coronary calcium score of 0.    Her blood pressure was elevated and enalapril was switched to to olmesartan 20 mg.  Prior to that change Lori had a cough that has since improved.    Lori was hospitalized for withdrawal from gabapentin.  Telmisartan was subsequently switched to telmisartan/HCTZ.  In Luna Lori had an episode of waking up with left chest and arm pain.  Lori described it as a burning pain that did not change with exertion, though Lori has been on it while unable to exert herself much lately due to an injury to her foot.  There is no associated shortness of breath and the pain was better with palpation of the chest and arm.   Lori Luna had chest pain that was thought to be due to cervical radiculopathy.  Her blood pressures were running high at home but were controlled in the office.  Lori followed up with Edd Fabian, NP on 9/Luna and was doing better.  Lori Luna. At her last appointment, her blood pressure was above goal but Lori wanted to work on diet and exercise. Her pharmacist added amlodipine and Lori participated in [place].  Lori has participated in [facility].  Lori had surgery for an acoustic neuroma that was causing hearing loss and contributed to disequilibrium/balance issues.  Lori Luna, Lori Luna. Her symptoms had been gradually worsening since that  time. Lori has felt like Lori went back into menopause and still has issues with vertigo. Lori complains of bilateral LE edema as well as labored breathing when climbing up and down stairs. Additionally her sleep apnea worsened significantly; for 2 months now Lori has resumed her CPAP and has noticed having more energy during the day. Recently Lori had a brain MRI confirming no recurrent tumor. In the office her blood pressure is 124/82. Lori has noticed that earlier in the mornings her diastolic blood pressure is closer to 70 but increases up to 85 mmHg later in the day. EKG performed Lori Luna shows sinus rhythm, cannot rule out prior inferior infarct. Lori is trying to increase her exercise but also focusing on her work. Lori does exercise using the seated elliptical machine 30-40 minutes, 3 times per week. The range of exercise machines Lori can use is limited by her vertigo. Her weight in the office Lori Luna is 242 lbs which Lori notes is her highest adult weight. Lori is struggling with weight loss and wishes to discuss GLP1 therapy. Currently Lori is taking OTC potassium and vitamin D supplements. Lori denies any palpitations, chest pain, headaches, syncope, orthopnea, or PND.  ROS:  Please see the history of present illness. All other systems are reviewed and negative.  (+) Intermittent dizziness (+) BLE edema (+) Exertional shortness of breath  Studies Reviewed: Lori Luna Kitchen   EKG Interpretation Date/Time:  Friday January 22 2023 10:54:38 EDT Ventricular Rate:  75 PR Interval:  158 QRS Duration:  76 QT Interval:  398 QTC Calculation: 444 R Axis:   31  Text Interpretation: Normal sinus rhythm Possible Inferior infarct , age undetermined When compared with ECG of 07-Jul-2021 13:27, Borderline criteria for Inferior infarct are now Present Confirmed by Chilton Si ([id number]) on 01/22/2023 11:00:41 AM    Risk Assessment/Calculations:             Physical Exam:    VS:  BP 124/82   Pulse 85   Ht 5\' 6"  (1.676 m)    Wt 242 lb 3.2 oz (109.9 kg)   LMP 10/19/2018 (Exact Date)   SpO2 98%   BMI 39.09 kg/m  , BMI Body mass index is 39.09 kg/m. GENERAL:  Well appearing HEENT: Pupils equal round and reactive, fundi not visualized, oral mucosa unremarkable NECK:  No jugular venous distention, waveform within normal limits, carotid upstroke brisk and symmetric, no bruits, no thyromegaly LUNGS:  Clear to auscultation bilaterally HEART:  RRR.  PMI not displaced or sustained,S1 and S2 within normal limits, no S3, no S4, no clicks, no rubs, no murmurs ABD:  Flat, positive bowel sounds normal in frequency in pitch, no bruits, no rebound, no guarding, no midline pulsatile mass, no hepatomegaly, no splenomegaly EXT:  2 plus pulses throughout, no edema, no cyanosis no clubbing SKIN:  No rashes no nodules NEURO:  Cranial nerves II through XII grossly intact, motor grossly intact throughout PSYCH:  Cognitively intact, oriented to person place and time  Wt Readings from Last 3 Encounters:  01/22/23 242 lb 3.2 oz (109.9 kg)  09/25/21 220 lb 12.8 oz (100.2 kg)  08/12/21 227 lb 12.8 oz (103.3 kg)     ASSESSMENT AND PLAN: .    # Obesity Patient reports being at her highest weight ever and is interested in GLP-1 agonists for weight loss. Discussed the benefits and costs of Wegovy, Ozempic, and Zepbound. -Refer to pharmacist for further discussion on GLP-1 agonists and insurance coverage. -Order LP (a), lipid panel, comprehensive metabolic panel, and A1c in one week.  # Hyperlipidemia LDL cholesterol is elevated (129). Discussed the benefits of statins in reducing the risk of heart disease and stroke, and the potential side effects. Patient will research and decide. -If patient agrees, initiate low-dose statin therapy.  # Hypertension Patient reports blood pressure is well-controlled with current medication regimen, but is experiencing peripheral edema. Discussed the potential contribution of amlodipine to  edema. -Discontinue amlodipine and potassium supplement. -Initiate spironolactone 25mg  daily.  Continue Olmesartan/HCTZ. -Check blood pressure at home and record readings. -Follow up with pharmacist in one month for potential medication adjustments.  # Sleep Apnea Patient reports return of sleep apnea symptoms and is currently using CPAP. -Continue CPAP use.  # Acoustic neuroma: Patient reports recovery from brain surgery for tumor removal. Current scans show no residual tumor. -Continue follow-up with neurology and ENT as needed.  # Vertigo Patient reports ongoing vertigo, limiting her ability to exercise. -Continue vestibular exercises at home. -Encourage seated exercises as tolerated.  General Health Maintenance -Encourage return to healthy diet and exercise regimen when able. -Consider resuming yoga when comfortable.      Dispo:  FU with PharmD in 1 month. FU with Gjon Letarte C. Duke Salvia, MD, [facility] in 6 months.  I,Mathew Stumpf,acting as a Neurosurgeon for Chilton Si, MD.,have documented all relevant documentation on the behalf of Chilton Si, MD,as directed by  Chilton Si, MD while in the presence of Chilton Si, MD.  I, Elmarie Shiley  Sherryl Manges, MD have reviewed all documentation for this visit.  The documentation of the exam, diagnosis, procedures, and orders on 01/22/2023 are all accurate and complete.   Signed, Chilton Si, MD

## 2023-01-22 NOTE — Progress Notes (Deleted)
  Cardiology Office Note:  .   Date:  01/22/2023  ID:  Lori Luna, DOB [birth year], MRN [id number] PCP: Gweneth Dimitri, MD  [facility] Providers Cardiologist:  Chilton Si, MD { Click to update primary MD,subspecialty MD or APP then REFRESH:1}   History of Present Illness: Lori Luna   Lori Luna is a 57 y.o. female with hypertension and hyperlipidemia who presents for follow-up.  Lori Luna was initially seen 06/2016 for cardiovascular risk assessment.  She has a family history of CAD and had some episodes of atypical chest pain.  She was referred for coronary CT-a 07/2016 that revealed a coronary calcium score of 0.    Her blood pressure was elevated and enalapril was switched to to olmesartan 20 mg.  Prior to that change she had a cough that has since improved.    She was hospitalized for withdrawal from gabapentin.  Telmisartan was subsequently switched to telmisartan/HCTZ.  In February she had an episode of waking up with left chest and arm pain.  She described it as a burning pain that did not change with exertion, though she has been on it while unable to exert herself much lately due to an injury to her foot.  There is no associated shortness of breath and the pain was better with palpation of the chest and arm.   Lori Luna had chest pain that was thought to be due to cervical radiculopathy.  Her blood pressures were running high at home but were controlled in the office.  She followed up with Edd Fabian, NP on 12/2019 and was doing better.  Lori Luna had COVID-19 in early January 2021. At her last appointment, her blood pressure was above goal but she wanted to work on diet and exercise. Her pharmacist added amlodipine and she participated in [place].  She has participated in [facility].  She had surgery for an acoustic neuroma that was causing hearing loss and contributed to disequilibrium/balance issues.  ROS: ***  Studies Reviewed: .         *** Risk Assessment/Calculations:   {Does this patient have ATRIAL FIBRILLATION?:[id number]} No BP recorded.  {Refresh Note OR Click here to enter BP  :1}***       Physical Exam:   VS:  LMP 10/19/2018 (Exact Date)    Wt Readings from Last 3 Encounters:  09/25/21 220 lb 12.8 oz (100.2 kg)  08/12/21 227 lb 12.8 oz (103.3 kg)  07/09/21 229 lb 9.6 oz (104.1 kg)    GEN: Well nourished, well developed in no acute distress NECK: No JVD; No carotid bruits CARDIAC: ***RRR, no murmurs, rubs, gallops RESPIRATORY:  Clear to auscultation without rales, wheezing or rhonchi  ABDOMEN: Soft, non-tender, non-distended EXTREMITIES:  No edema; No deformity   ASSESSMENT AND PLAN: .   ***    {Are you ordering a CV Procedure (e.g. stress test, cath, DCCV, TEE, etc)?   Press F2        :[id number]}  Dispo: ***  Signed, Chilton Si, MD

## 2023-01-22 NOTE — Patient Instructions (Signed)
Medication Instructions:  STOP AMLODIPINE   START SPIRONOLACTONE 25 MG DAILY   *If you need a refill on your cardiac medications before your next appointment, please call your pharmacy*  Lab Work: FASTING LP/CMET/A1C/LPa IN ABOUT 1 WEEK   If you have labs (blood work) drawn today and your tests are completely normal, you will receive your results only by: MyChart Message (if you have MyChart) OR A paper copy in the mail If you have any lab test that is abnormal or we need to change your treatment, we will call you to review the results.  Testing/Procedures: NONE  Follow-Up: At [facility], you and your health needs are our priority.  As part of our continuing mission to provide you with exceptional heart care, we have created designated Provider Care Teams.  These Care Teams include your primary Cardiologist (physician) and Advanced Practice Providers (APPs -  Physician Assistants and Nurse Practitioners) who all work together to provide you with the care you need, when you need it.  We recommend signing up for the patient portal called "MyChart".  Sign up information is provided on this After Visit Summary.  MyChart is used to connect with patients for Virtual Visits (Telemedicine).  Patients are able to view lab/test results, encounter notes, upcoming appointments, etc.  Non-urgent messages can be sent to your provider as well.   To learn more about what you can do with MyChart, go to [email or web address].    Your next appointment:   6 month(s)  Provider:   Chilton Si, MD  1 MONTH WITH PHARM D AT EITHER [facility] OR [facility] LOCATION

## 2023-01-28 DIAGNOSIS — G4733 Obstructive sleep apnea (adult) (pediatric): Secondary | ICD-10-CM | POA: Diagnosis not present

## 2023-01-29 ENCOUNTER — Telehealth (HOSPITAL_BASED_OUTPATIENT_CLINIC_OR_DEPARTMENT_OTHER): Payer: Self-pay | Admitting: Cardiovascular Disease

## 2023-01-29 DIAGNOSIS — Z09 Encounter for follow-up examination after completed treatment for conditions other than malignant neoplasm: Secondary | ICD-10-CM | POA: Diagnosis not present

## 2023-01-29 DIAGNOSIS — E785 Hyperlipidemia, unspecified: Secondary | ICD-10-CM | POA: Diagnosis not present

## 2023-01-29 DIAGNOSIS — I10 Essential (primary) hypertension: Secondary | ICD-10-CM | POA: Diagnosis not present

## 2023-01-29 NOTE — Telephone Encounter (Signed)
Patient called to see if someone could call her ASAP because her Blood pressure is super high and she needed some advice

## 2023-01-29 NOTE — Telephone Encounter (Signed)
Returned call to patient,   Patient states that she was 10/4 and had medication changes. Midday 140/105, short of breath, headache, dizzy x3 days.   Has been on new medication since Sunday. Advised patient of provider recommendations. She does still have amlodipine at home and will use as needed for BP greater than 140/90. She had her labs done this morning. Advised we should have them back by Monday, will have provider review them Monday and call to check on BP through the weekend to decide if further med changes needed.

## 2023-01-29 NOTE — Telephone Encounter (Signed)
Seen 01/22/23 with BP at goal at home and in clinic. Amlodipine and potassium were stopped. Spironolactone initiated to help with peripheral edema. Olmesartan/HCTZ continued.  Inquire if she was able to stop Amlodipine/Potassium and start Spironoalctone.   If BP is >140/90, she may take a tablet of Amlodipine if she still has old Rx. If she does not have her Amlodipine, she may take an additional 12.5mg  of Spironolactone.   As a reminder she needs to have repeat labs done Monday if she did not have them done today.  Alver Sorrow, NP

## 2023-01-30 LAB — COMPREHENSIVE METABOLIC PANEL
ALT: 32 [IU]/L (ref 0–32)
AST: 21 [IU]/L (ref 0–40)
Albumin: 4.4 g/dL (ref 3.8–4.9)
Alkaline Phosphatase: 84 [IU]/L (ref 44–121)
BUN/Creatinine Ratio: 22 (ref 9–23)
BUN: 19 mg/dL (ref 6–24)
Bilirubin Total: 0.3 mg/dL (ref 0.0–1.2)
CO2: 21 mmol/L (ref 20–29)
Calcium: 9.5 mg/dL (ref 8.7–10.2)
Chloride: 105 mmol/L (ref 96–106)
Creatinine, Ser: 0.87 mg/dL (ref 0.57–1.00)
Globulin, Total: 2.5 g/dL (ref 1.5–4.5)
Glucose: 93 mg/dL (ref 70–99)
Potassium: 4.2 mmol/L (ref 3.5–5.2)
Sodium: 142 mmol/L (ref 134–144)
Total Protein: 6.9 g/dL (ref 6.0–8.5)
eGFR: 78 mL/min/{1.73_m2} (ref >59)

## 2023-01-30 LAB — LIPID PANEL
Chol/HDL Ratio: 3.6 {ratio} (ref 0.0–4.4)
Cholesterol, Total: 209 mg/dL — ABNORMAL HIGH (ref 100–199)
HDL: 58 mg/dL (ref >39)
LDL Chol Calc (NIH): 127 mg/dL — ABNORMAL HIGH (ref 0–99)
Triglycerides: 136 mg/dL (ref 0–149)
VLDL Cholesterol Cal: 24 mg/dL (ref 5–40)

## 2023-01-30 LAB — LIPOPROTEIN A (LPA): Lipoprotein (a): 239.1 nmol/L — ABNORMAL HIGH (ref <75.0)

## 2023-01-30 LAB — HEMOGLOBIN A1C
Est. average glucose Bld gHb Est-mCnc: 114 mg/dL
Hgb A1c MFr Bld: 5.6 % (ref 4.8–5.6)

## 2023-02-01 ENCOUNTER — Encounter (HOSPITAL_BASED_OUTPATIENT_CLINIC_OR_DEPARTMENT_OTHER): Payer: Self-pay | Admitting: Cardiovascular Disease

## 2023-02-01 ENCOUNTER — Telehealth (HOSPITAL_BASED_OUTPATIENT_CLINIC_OR_DEPARTMENT_OTHER): Payer: Self-pay

## 2023-02-01 NOTE — Telephone Encounter (Signed)
Lab results as follows: Normal kidneys, liver, electrolytes.  A1c with no diabetes nor prediabetes.  Lipoprotein a elevated indicating familial hyperlipidemia and higher cardiovascular risk.  Cholesterol numbers are elevated.  As discussed in clinic visit would recommend initiation of low-dose statin rosuvastatin 10 mg daily with repeat FLP/LFT in 2-3 months. If she wishes to wait to discuss with pharmacy team at upcoming appointment that is also fine.  We need to know BP readings over the weekend and how often she took Amlodipine over the weekend prior to making additional recommendations.   Lori Sorrow, NP

## 2023-02-01 NOTE — Telephone Encounter (Signed)
Labs back, please review and advise

## 2023-02-01 NOTE — Telephone Encounter (Signed)
Results released via mychart, and viewed by patient. Follow up mychart messages sent in separate encounter.

## 2023-02-01 NOTE — Telephone Encounter (Addendum)
Seen by patient Lori Luna on 02/01/2023 12:28 PM; follow up mychart message sent to patient.    ----- Message from Alver Sorrow sent at 02/01/2023 12:04 PM EDT ----- Normal kidneys, liver, electrolytes.  A1c with no diabetes nor prediabetes.  Lipoprotein a elevated indicating familial hyperlipidemia and higher cardiovascular risk.  Cholesterol numbers are elevated.  As discussed in clinic visit would recommend initiation of low-dose statin rosuvastatin 10 mg daily with repeat FLP/LFT in 2-3 months. If she wishes to wait to discuss with pharmacy team at upcoming appointment that is also fine.

## 2023-02-12 NOTE — Telephone Encounter (Signed)
Updated BP  

## 2023-02-18 DIAGNOSIS — J019 Acute sinusitis, unspecified: Secondary | ICD-10-CM | POA: Diagnosis not present

## 2023-02-18 DIAGNOSIS — B9689 Other specified bacterial agents as the cause of diseases classified elsewhere: Secondary | ICD-10-CM | POA: Diagnosis not present

## 2023-02-22 DIAGNOSIS — Z0289 Encounter for other administrative examinations: Secondary | ICD-10-CM

## 2023-02-25 ENCOUNTER — Ambulatory Visit (INDEPENDENT_AMBULATORY_CARE_PROVIDER_SITE_OTHER): Payer: BC Managed Care – PPO | Admitting: Pharmacist Clinician (PhC)/ Clinical Pharmacy Specialist

## 2023-02-25 VITALS — Ht 63.0 in | Wt 241.0 lb

## 2023-02-25 DIAGNOSIS — E66812 Obesity, class 2: Secondary | ICD-10-CM

## 2023-02-25 DIAGNOSIS — Z6837 Body mass index (BMI) 37.0-37.9, adult: Secondary | ICD-10-CM | POA: Diagnosis not present

## 2023-02-25 NOTE — Patient Instructions (Addendum)
We will reach out to [employer] to get Zepbound for you.    TIPS FOR SUCCESS Write down the reasons why you want to lose weight and post it in a place where you'll see it often. Start small and work your way up. Keep in mind that it takes time to achieve goals, and small steps add up. Any additional movements help to burn calories. Taking the stairs rather than the elevator and parking at the far end of your parking lot are easy ways to start. Brisk walking for at least 30 minutes 4 or more days of the week is an excellent goal to work toward  [employer] WHAT IT MEANS TO FEEL FULL Did you know that it can take 15 minutes or more for your brain to receive the message that you've eaten? That means that, if you eat less food, but consume it slower, you may still feel satisfied. Eating a lot of fruits and vegetables can also help you feel fuller. Eat off of smaller plates so that moderate portions don't seem too small  Start with 2.5 mg once weekly for 4 weeks ($300)  then increase to 5 mg weekly ($400).  If you feel we need to move the dose up, we can consider switching to [facility] ($650/month)  Follow up in 3 month.  If you have any questions or concerns, please reach out to [name].  Nico Rogness/Chris at [phone].  THANK YOU FOR CHOOSING [facility]

## 2023-02-25 NOTE — Progress Notes (Unsigned)
Office Visit    Patient Name: Lori Luna Date of Encounter: 02/25/2023  Primary Care Provider:  Gweneth Dimitri, MD Primary Cardiologist:  Chilton Si, MD  Chief Complaint    Weight management, hypertension  Significant Past Medical History   HTN Currently well controlled on olmesartan, hctz, spironolactone  HLD 10/24 LDL 127, Lp(a) 239.1 wants to research treatment options  OSA On CPAP    Allergies  Allergen Reactions   Amoxicillin-Pot Clavulanate Diarrhea and Nausea And Vomiting    History of Present Illness    Lori Luna is a 57 y.o. female patient of Dr Duke Salvia, in the office today to discuss options for weight management, as well as check BP in light of recent medication changes.    Was in PREP, lost about 25 ounds, kept off until after surgery.  (Vestibular), trying to do 100 gm protein, lots of water  Current weight management medications: none  Previously tried meds: phentermine about 20 years ago; OPTIVIA  Current meds that may affect weight: none  Baseline weight/BMI:   Insurance payor:  [employer] (card lists 50% cost on tier 2-5 meds)  Diet:   Exercise:   Family History:   Confirmed patient not ***pregnant and no personal or family history of medullary thyroid carcinoma (MTC) or Multiple Endocrine Neoplasia syndrome type 2 (MEN 2).   Social History:   Tobacco:  Alcohol:  Caffeine:   Adherence Assessment  Do you ever forget to take your medication? [] Yes [] No  Do you ever skip doses due to side effects? [] Yes [] No  Do you have trouble affording your medicines? [] Yes [] No  Are you ever unable to pick up your medication due to transportation difficulties? [] Yes [] No  Do you ever stop taking your medications because you don't believe they are helping? [] Yes [] No  Do you check your weight daily? [] Yes [] No   Adherence strategy: ***  Barriers to obtaining medications: ***   Accessory Clinical  Findings    Lab Results  Component Value Date   CREATININE 0.87 01/29/2023   BUN 19 01/29/2023   NA 142 01/29/2023   K 4.2 01/29/2023   CL 105 01/29/2023   CO2 21 01/29/2023   Lab Results  Component Value Date   ALT 32 01/29/2023   AST 21 01/29/2023   ALKPHOS 84 01/29/2023   BILITOT 0.3 01/29/2023   Lab Results  Component Value Date   HGBA1C 5.6 01/29/2023      Home Medications/Allergies    Current Outpatient Medications  Medication Sig Dispense Refill   ASPIRIN 81 PO Take 1 tablet by mouth daily.     betamethasone dipropionate 0.05 % cream Apply 1 application. topically as needed.     ibuprofen (ADVIL) 800 MG tablet Take 1 tablet (800 mg total) by mouth every 8 (eight) hours as needed. 30 tablet 0   loratadine (CLARITIN) 10 MG tablet Take 10 mg by mouth daily as needed for allergies.     methocarbamol (ROBAXIN) 500 MG tablet Take 500 mg by mouth every 6 (six) hours as needed for muscle spasms.     metroNIDAZOLE (FLAGYL) 500 MG tablet Take 1 tablet (500 mg total) by mouth 2 (two) times daily. 14 tablet 0   olmesartan-hydrochlorothiazide (BENICAR HCT) 40-12.5 MG tablet TAKE 1 TABLET BY MOUTH EVERY DAY 90 tablet 1   Rhubarb (ESTROVEN COMPLETE) 4 MG TABS Take 4 mg by mouth daily. (Patient not taking: Reported on 01/22/2023)     spironolactone (ALDACTONE) 25  MG tablet Take 1 tablet (25 mg total) by mouth daily. 90 tablet 3   traMADol (ULTRAM) 50 MG tablet Take 1 tablet (50 mg total) by mouth every 6 (six) hours as needed. 20 tablet 0   tretinoin (RETIN-A) 0.025 % cream Apply 1 application  topically daily.     No current facility-administered medications for this visit.     Allergies  Allergen Reactions   Amoxicillin-Pot Clavulanate Diarrhea and Nausea And Vomiting    Assessment & Plan    No problem-specific Assessment & Plan notes found for this encounter.   Phillips Hay PharmD CPP [facility]  [address] [place], [place]  [address] [phone]

## 2023-03-04 ENCOUNTER — Encounter (HOSPITAL_BASED_OUTPATIENT_CLINIC_OR_DEPARTMENT_OTHER): Payer: Self-pay | Admitting: Pharmacist Clinician (PhC)/ Clinical Pharmacy Specialist

## 2023-03-04 MED ORDER — TIRZEPATIDE-WEIGHT MANAGEMENT 5 MG/0.5ML ~~LOC~~ SOLN: 5.0000 mg | SUBCUTANEOUS | 3 refills | Status: DC

## 2023-03-04 MED ORDER — TIRZEPATIDE-WEIGHT MANAGEMENT 2.5 MG/0.5ML ~~LOC~~ SOLN: 2.5000 mg | SUBCUTANEOUS | 1 refills | Status: DC

## 2023-03-04 NOTE — Assessment & Plan Note (Signed)
  Patient has not met goal of at least 5% of body weight loss with comprehensive lifestyle modifications alone in the past 3-6 months. Pharmacotherapy is appropriate to pursue as augmentation. Will start Zepbound.   Confirmed patient not pregnant and no personal or family history of medullary thyroid carcinoma (MTC) or Multiple Endocrine Neoplasia syndrome type 2 (MEN 2).   Advised patient on common side effects including nausea, diarrhea, dyspepsia, decreased appetite, and fatigue. Counseled patient on reducing meal size and how to titrate medication to minimize side effects. Patient aware to call if intolerable side effects or if experiencing dehydration, abdominal pain, or dizziness. Patient will adhere to dietary modifications and will target at least 150 minutes of moderate intensity exercise weekly.   Injection technique reviewed at today's visit.    Titration Plan:  Will plan to follow the titration plan as below, pending patient is tolerating each dose before increasing to the next. Can slow titration if needed for tolerability.    -Month 1: Inject 2.5 mg SQ once weekly x 4 weeks -Month 2: Inject 5 mg  SQ once weekly x 4 weeks  She will go through [employer] as the medications are not covered.  Should she need a higher dose for weight management, can consider switching to [employer], which is a little more pricey.    Follow up in 3 months - after starting medication.

## 2023-03-05 ENCOUNTER — Other Ambulatory Visit: Payer: Self-pay | Admitting: Pharmacist Clinician (PhC)/ Clinical Pharmacy Specialist

## 2023-03-05 MED ORDER — TIRZEPATIDE-WEIGHT MANAGEMENT 5 MG/0.5ML ~~LOC~~ SOLN: 5.0000 mg | SUBCUTANEOUS | 3 refills | Status: DC

## 2023-03-05 MED ORDER — TIRZEPATIDE-WEIGHT MANAGEMENT 2.5 MG/0.5ML ~~LOC~~ SOLN: 2.5000 mg | SUBCUTANEOUS | 1 refills | Status: DC

## 2023-03-08 DIAGNOSIS — G4733 Obstructive sleep apnea (adult) (pediatric): Secondary | ICD-10-CM | POA: Diagnosis not present

## 2023-03-11 ENCOUNTER — Ambulatory Visit (HOSPITAL_BASED_OUTPATIENT_CLINIC_OR_DEPARTMENT_OTHER): Payer: BC Managed Care – PPO

## 2023-03-11 ENCOUNTER — Other Ambulatory Visit (HOSPITAL_BASED_OUTPATIENT_CLINIC_OR_DEPARTMENT_OTHER): Payer: Self-pay | Admitting: Obstetrics & Gynecology

## 2023-03-11 ENCOUNTER — Encounter (HOSPITAL_BASED_OUTPATIENT_CLINIC_OR_DEPARTMENT_OTHER): Payer: Self-pay

## 2023-03-11 ENCOUNTER — Other Ambulatory Visit (HOSPITAL_COMMUNITY)
Admission: RE | Admit: 2023-03-11 | Discharge: 2023-03-11 | Disposition: A | Discharge status: Home / Self Care | Payer: BC Managed Care – PPO | Source: Ambulatory Visit | Attending: Obstetrics & Gynecology | Admitting: Obstetrics & Gynecology

## 2023-03-11 VITALS — BP 124/72 | HR 78 | Ht 65.5 in | Wt 240.8 lb

## 2023-03-11 DIAGNOSIS — R300 Dysuria: Secondary | ICD-10-CM | POA: Diagnosis not present

## 2023-03-11 DIAGNOSIS — N898 Other specified noninflammatory disorders of vagina: Secondary | ICD-10-CM | POA: Insufficient documentation

## 2023-03-11 LAB — POCT URINALYSIS DIPSTICK
Bilirubin, UA: NEGATIVE
Blood, UA: NEGATIVE
Glucose, UA: NEGATIVE
Ketones, UA: NEGATIVE
Leukocytes, UA: NEGATIVE
Nitrite, UA: NEGATIVE
Protein, UA: NEGATIVE
Spec Grav, UA: 1.025 (ref 1.010–1.025)
Urobilinogen, UA: 0.2 U/dL
pH, UA: 5.5 (ref 5.0–8.0)

## 2023-03-11 MED ORDER — NITROFURANTOIN MONOHYD MACRO 100 MG PO CAPS: 100.0000 mg | ORAL_CAPSULE | Freq: Two times a day (BID) | ORAL | 0 refills | Status: DC

## 2023-03-11 NOTE — Progress Notes (Signed)
Patient came in today to give a urine for evaluation and to submit an aptima self swab. Patient states she is experiencing some lower abdominal discomfort, burning sensation while urinating and foul odor. Urine was collected, evaluated and sent for urine culture. Aptima swab was collected and sent as well. Patient informed that she will be notified of results via MyChart and/or phone call. tbw

## 2023-03-12 LAB — URINE CULTURE

## 2023-03-14 ENCOUNTER — Other Ambulatory Visit (HOSPITAL_BASED_OUTPATIENT_CLINIC_OR_DEPARTMENT_OTHER): Payer: Self-pay | Admitting: Cardiovascular Disease

## 2023-03-15 LAB — CERVICOVAGINAL ANCILLARY ONLY
Bacterial Vaginitis (gardnerella): NEGATIVE
Candida Glabrata: NEGATIVE
Candida Vaginitis: NEGATIVE
Comment: NEGATIVE
Comment: NEGATIVE
Comment: NEGATIVE

## 2023-03-22 IMAGING — MG MM DIGITAL DIAGNOSTIC UNILAT*R* W/ TOMO W/ CAD
6 series · 6 of 18 positions shown · non-contrast
Comparison: Previous exam(s).

CLINICAL DATA: 56-year-old female recalled from screening mammogram
dated 08/22/2021 for a possible right breast distortion.

EXAM:
DIGITAL DIAGNOSTIC UNILATERAL RIGHT MAMMOGRAM WITH TOMOSYNTHESIS AND
CAD
TECHNIQUE: Right digital diagnostic mammography and breast tomosynthesis was
performed. The images were evaluated with computer-aided detection.

[R ML synth-2D]
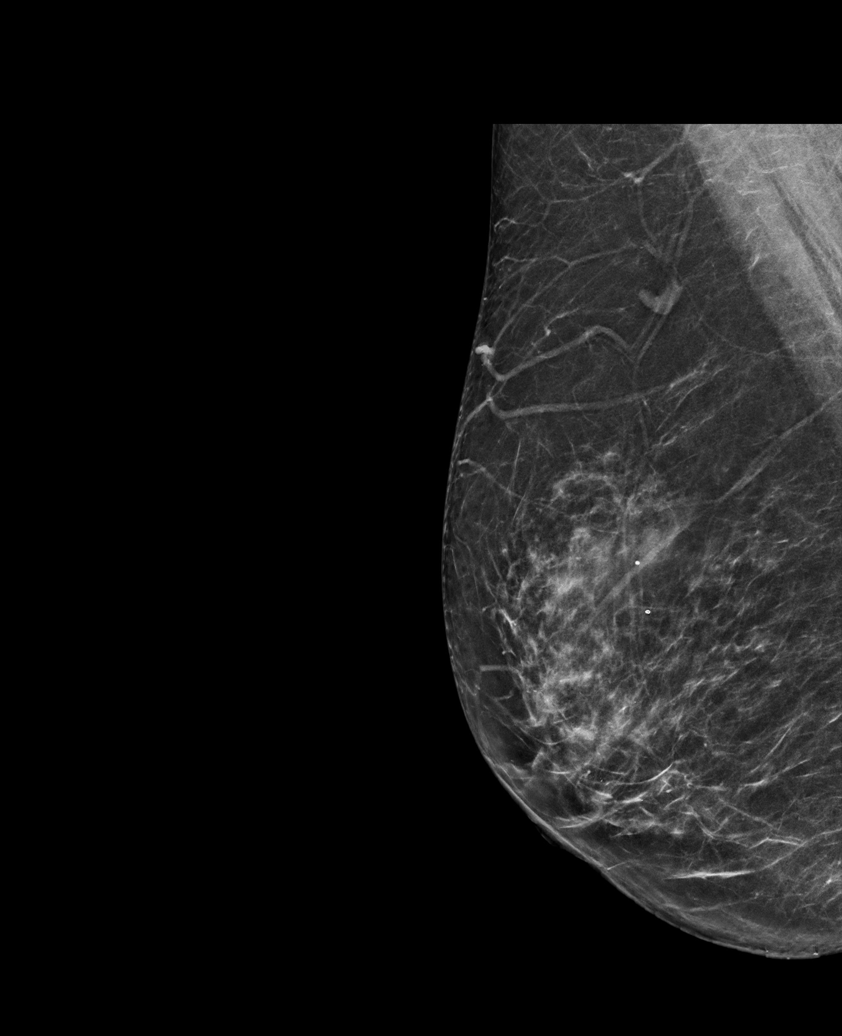

[R MLO synth-2D (1 of 2)]
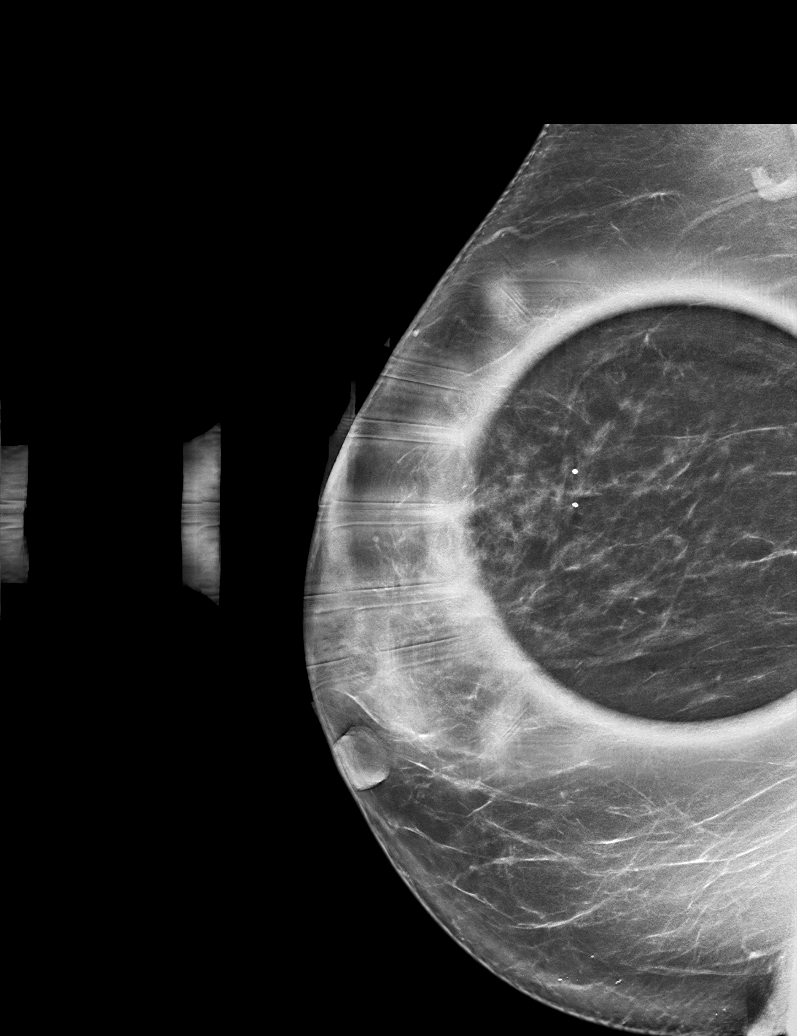

[R MLO synth-2D (2 of 2)]
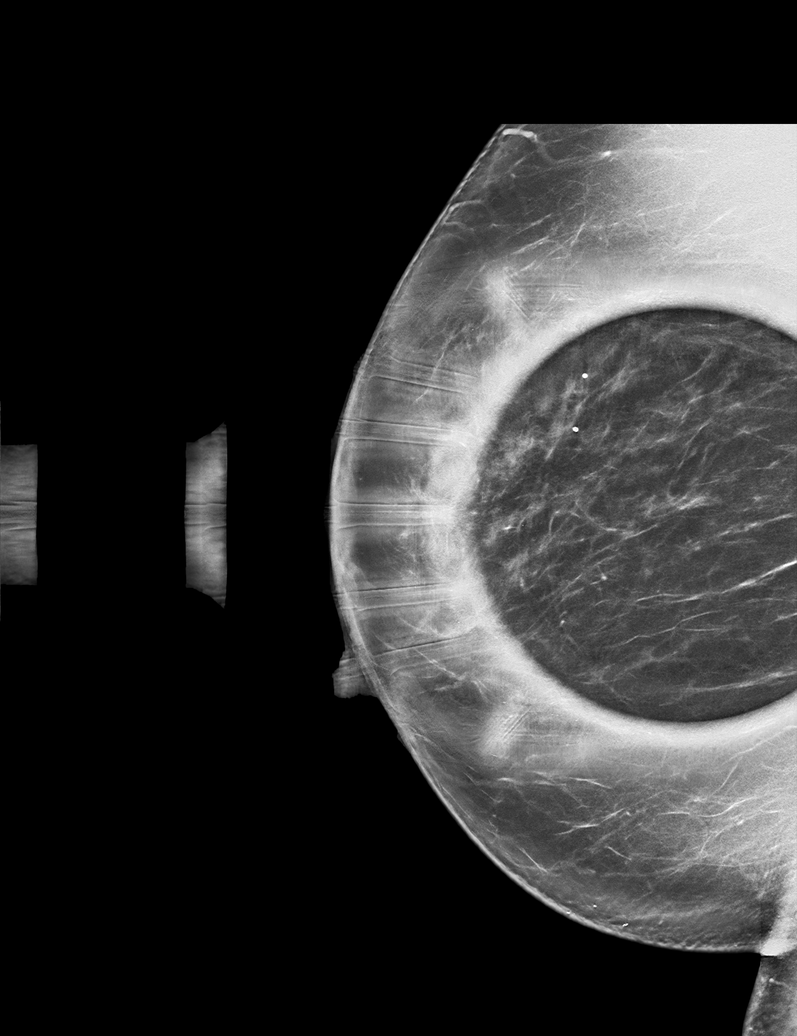

[R ML tomo · tomo slice 37/72.0]
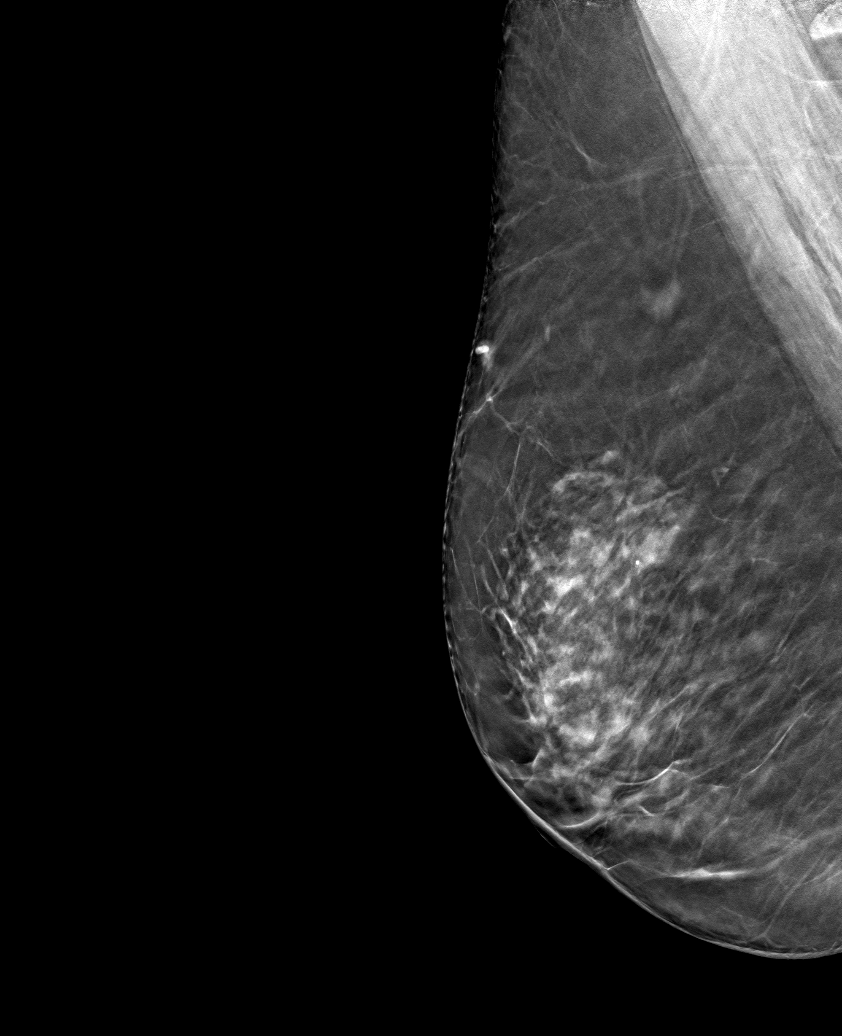

[R MLO tomo (1 of 2) · tomo slice 38/75.0]
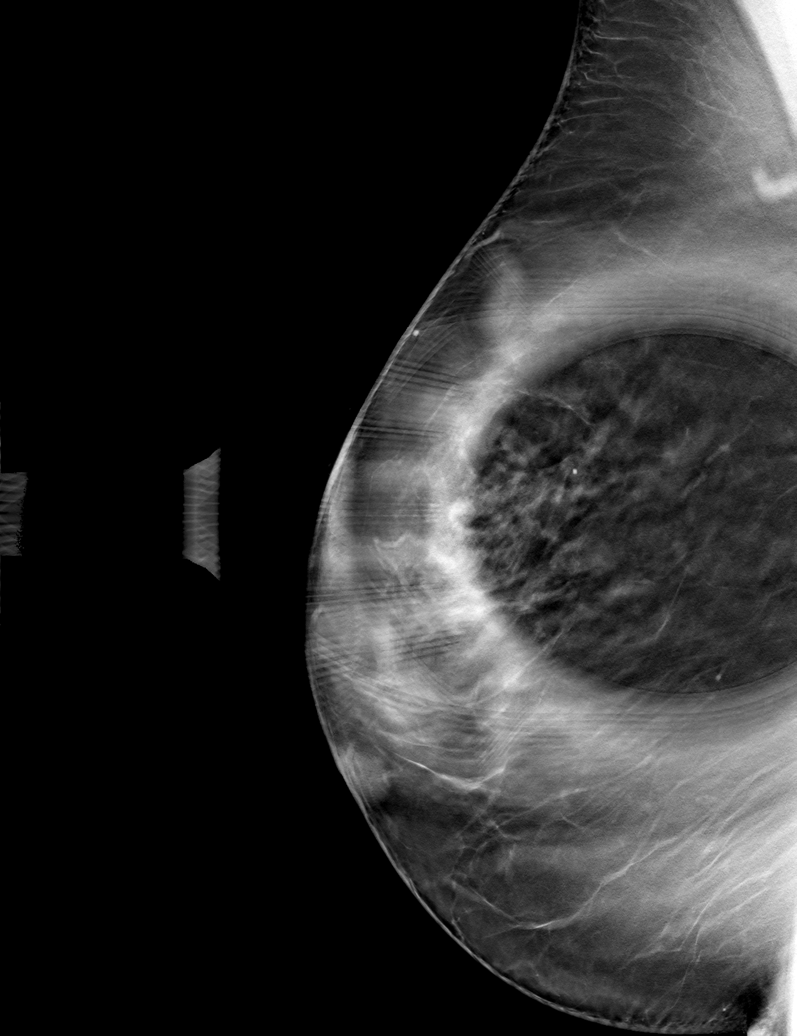

[R MLO tomo (2 of 2) · tomo slice 36/71.0]
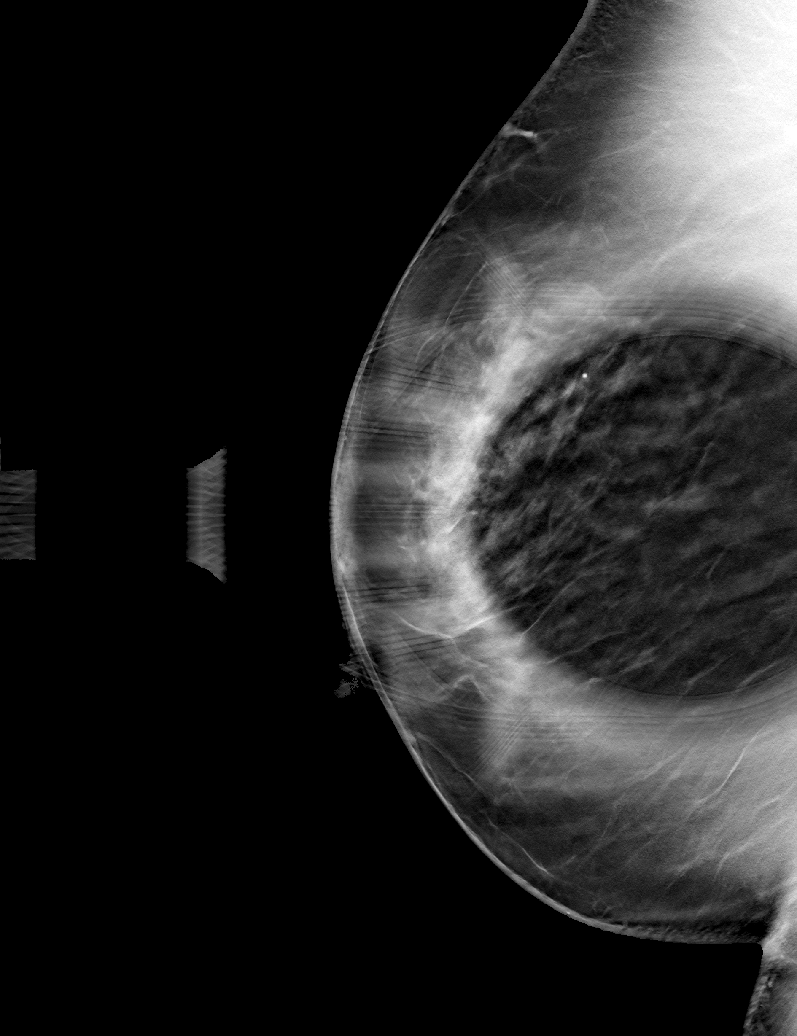

[6 of 18 positions shown; findings below may reference images not displayed]

ACR Breast Density Category c: The breast tissue is heterogeneously
dense, which may obscure small masses.
FINDINGS: Possible distortion in the upper right breast at mid depth seen on
the MLO projection only resolves into well dispersed fibroglandular
tissue on additional views. No suspicious findings identified.
IMPRESSION: No mammographic evidence of malignancy.

RECOMMENDATION:
Screening mammogram in one year.(Code:90-K-W70)

I have discussed the findings and recommendations with the patient.
If applicable, a reminder letter will be sent to the patient
regarding the next appointment.

BI-RADS CATEGORY  1: Negative.

## 2023-03-24 ENCOUNTER — Encounter (INDEPENDENT_AMBULATORY_CARE_PROVIDER_SITE_OTHER): Payer: Self-pay | Admitting: Family Medicine

## 2023-03-24 ENCOUNTER — Ambulatory Visit (INDEPENDENT_AMBULATORY_CARE_PROVIDER_SITE_OTHER): Payer: BC Managed Care – PPO | Admitting: Family Medicine

## 2023-03-24 VITALS — BP 130/86 | HR 76 | Temp 97.9°F | Ht 65.5 in | Wt 235.0 lb

## 2023-03-24 DIAGNOSIS — E559 Vitamin D deficiency, unspecified: Secondary | ICD-10-CM

## 2023-03-24 DIAGNOSIS — R0602 Shortness of breath: Secondary | ICD-10-CM

## 2023-03-24 DIAGNOSIS — E785 Hyperlipidemia, unspecified: Secondary | ICD-10-CM

## 2023-03-24 DIAGNOSIS — Z1331 Encounter for screening for depression: Secondary | ICD-10-CM | POA: Diagnosis not present

## 2023-03-24 DIAGNOSIS — G4733 Obstructive sleep apnea (adult) (pediatric): Secondary | ICD-10-CM

## 2023-03-24 DIAGNOSIS — I10 Essential (primary) hypertension: Secondary | ICD-10-CM

## 2023-03-24 DIAGNOSIS — Z6838 Body mass index (BMI) 38.0-38.9, adult: Secondary | ICD-10-CM

## 2023-03-24 DIAGNOSIS — R5382 Chronic fatigue, unspecified: Secondary | ICD-10-CM | POA: Diagnosis not present

## 2023-03-24 NOTE — Progress Notes (Signed)
[name], D.O.  ABFM, ABOM Specializing in Clinical Bariatric Medicine Office located at: [address]  [place], [place]  [address]     Bariatric Medicine Visit  Dear Gweneth Dimitri, MD   Thank you for referring Lori Luna to our clinic today for evaluation.  We performed a consultation to discuss her options for treatment and educate the patient on her disease state.  The following note includes my evaluation and treatment recommendations.   Please do not hesitate to reach out to me directly if you have any further concerns.    Assessment and Plan:   FOR THE DISEASE OF OBESITY: Morbid obesity (HCC) -starting BMI 03/24/23-38.5 -     Insulin, random  BMI 38.0-38.9,adult -     Insulin, random  Since last office visit on N/A patient's  Muscle mass N/A. Fat mass N/A. Total body water N/A.  Counseling done on how various foods will affect these numbers and how to maximize success  No previous biometrics  Total lbs lost to date: N/A. Total weight loss percentage to date: N/A.   Recommended Dietary Goals Lori Luna is currently in the action stage of change. As such, her goal is to continue weight management plan. She has agreed to: Modified category 2 with 6oz at lunch.  - Her cardiologist prescribed Zepbound on 03/05/2023 and pt has not started this medication yet. She states that she cannot make herself do this and would now like to wait until she starts her weight loss journey with [name].  - I recommended [employer] to her.   Behavioral Intervention We discussed the following Behavioral Modification Strategies today: increasing lean protein intake to established goals, decreasing simple carbohydrates , work on meal planning and preparation, work on tracking and journaling calories using tracking application, decreasing eating out or consumption of processed foods, and making healthy choices when eating convenient foods, practice mindfulness eating and  understand the difference between hunger signals and cravings, avoiding temptations and identifying enticing environmental cues, continue to work on implementation of reduced calorie nutritional plan, continue to practice mindfulness when eating, and planning for success  Additional resources provided today: handout on CAT 2 meal plan, Handout on CAT 1-2 breakfast options, Handout on CAT 1-2 lunch options, and handout on protein equivalents of 2 ounces of meat or seafood  Evidence-based interventions for health behavior change were utilized today including the discussion of self monitoring techniques, problem-solving barriers and SMART goal setting techniques.   Regarding patient's less desirable eating habits and patterns, we employed the technique of small changes.   Pt will specifically work on: Beginning the prescribed meal plan for next visit.   Recommended Physical Activity Goals Lori Luna has been advised to work up to 150 minutes of moderate intensity aerobic activity a week and strengthening exercises 2-3 times per week for cardiovascular health, weight loss maintenance and preservation of muscle mass.   She has agreed to :  Continue current level of physical activity    Pharmacotherapy We discussed various medication options to help Lori Luna with her weight loss efforts and we both agreed to : continue with nutritional and behavioral strategies and continue current anti-obesity medication regimen   FOR ASSOCIATED CONDITIONS ADDRESSED TODAY: Fatigue Assessment:  Lori Luna does feel that her weight is causing her energy to be lower than it should be. Fatigue may be related to obesity, depression or many other causes. she does not appear to have any red flag symptoms and this appears to most  likely be related to her current lifestyle habits and dietary intake.  Plan:  Labs will be ordered and reviewed with her at their next office visit in two weeks. Chronic fatigue -     VITAMIN D 25  Hydroxy (Vit-D Deficiency, Fractures) -     Folate -     Insulin, random -     T4, free -     TSH -     Vitamin B12 -     Magnesium  Epworth sleepiness scale score appears to be within normal limits.  Her ESS score is 6.   Lori Luna admits to daytime somnolence and admits to waking up still tired. Patient has a history of symptoms of daytime fatigue. Hadia generally gets 6 hours of sleep per night, and states that she has N/A. Snoring is present. Apneic episodes are present.   ECG: Performed and reviewed/ interpreted independently.  EKG done on 01/22/2023 by Dr. Duke Salvia. Normal sinus rhythm and rate; little change from March, 2023. reassuring without any acute abnormalities, will continue to monitor for symptoms   Modified PHQ-9 Depression Screen: Her Food and Mood (modified PHQ-9) score was 10.   Shortness of breath on exertion Assessment:  Lori Luna does feel that she gets out of breath more easily than she used to when she exercises and seems to be worsening over time with weight gain.  This has gotten worse recently. Lori Luna denies shortness of breath at rest or orthopnea. Lori Luna's shortness of breath appears to be obesity related and exercise induced, as they do not appear to have any "red flag" symptoms/ concerns today.  Also, this condition appears to be related to a state of poor cardiovascular conditioning   Plan:  Obtain labs today and will be reviewed with her at their next office visit in two weeks. SOB (shortness of breath) on exertion -     T4, free -     TSH -     Vitamin B12  Indirect Calorimeter completed today to help guide our dietary regimen. It shows a VO2 of 291 and a REE of 2002.  Her calculated basal metabolic rate is 4782 thus her measured basal metabolic rate is better than expected.  Patient agreed to work on weight loss at this time.  As Lori Luna progresses through our weight loss program, we will gradually increase exercise as tolerated to treat her current  condition.   If Lori Luna follows our recommendations and loses 5-10% of their weight without improvement of her shortness of breath or if at any time, symptoms become more concerning, they agree to urgently follow up with their PCP/ specialist for further consideration/ evaluation.   Lori Luna verbalizes agreement with this plan.    Hypertension, unspecified type Assessment:  Condition is Controlled.Marland Kitchen Pt BP is controlled today. She is currently taking Benicar HCT and Spironolactone. She was recently placed on Spironolactone and states that she does not like the way this makes her feel and has caused SHOB. She informed me that she has a family hx of HTN and developed early onset HTN after pregnancy.  Last 3 blood pressure readings in our office are as follows: BP Readings from Last 3 Encounters:  03/24/23 130/86  03/11/23 124/72  01/22/23 124/82    Plan: Continue Spironolactone 25mg  and Benicar HCT 40-12.5mg  as directed by prescribing provider. Lori Luna BP is 130/86 at goal today. Counseled Lori Luna on pathophysiology of disease and discussed treatment plan, which always includes dietary and lifestyle modification as first line.  Reminded patient that if they ever feel poorly in any way, to check their blood pressure and pulse as well. We will continue to monitor closely alongside PCP/ specialists.  Pt reminded to also f/up with those individuals as instructed by them.  Hypertension, unspecified type -     CBC with Differential/Platelet -     Folate -     Vitamin B12 -     Magnesium   Hyperlipidemia, unspecified hyperlipidemia type Assessment & Plan:  Condition is Not optimized.. This is diet/exercise controlled. Her cardiologist has suggested she get on cholesterol medications but she denies as she wanted to try to lose weight naturally first to see if this will improve her LDL first.   I advised pt that it would be wise and beneficial to start HLD medications to  prevent her increased risk of heart attacks. Lori Luna agrees to begin our treatment plan of a heart-heathy, low cholesterol meal plan. Cardiovascular risk and specific lipid/LDL goals reviewed. We will continue routine screening as patient continues to achieve health goals along their weight loss journey Hyperlipidemia, unspecified hyperlipidemia type -     Folate -     Vitamin B12   Vitamin D deficiency Assessment & Plan:  Condition is Not at goal.. Pt does not take any vitamin D multi-vitamin supplements. She does was advised to stop all supplements when she had brain surgery and is ready to restart supplements.  I discussed the importance of vitamin D to the patient's health and well-being as well as to their ability to lose weight. I will recheck her levels today to evaluate which supplement would be best. Ideal vitamin D levels reviewed with patient   Vitamin D deficiency -     VITAMIN D 25 Hydroxy (Vit-D Deficiency, Fractures) -     Vitamin B12   OSA (obstructive sleep apnea) Assessment & Plan:  Condition is Controlled.. Pt was diagnosed with sleep apnea and was given a CPAP machine. She informed me that when she lost 20lbs she stopped snoring but did not redo a sleep study. She has now regained the weight and began to snore again.   I recommended pt continue to use her CPAP machine regularly and follow-up with specialist if problems arise.  OSA (obstructive sleep apnea) -     Vitamin B12  FOLLOW UP:   Follow up in 2 weeks. She was informed of the importance of frequent follow up visits to maximize her success with intensive lifestyle modifications for her multiple health conditions.  Lori Luna is aware that we will review all of her lab results at our next visit.  She is aware that if anything is critical/ life threatening with the results, we will be contacting her via MyChart prior to the office visit to discuss management.    Chief Complaint:    OBESITY Lori Luna (MR# [id number]) is a 57 y.o. female who presents for evaluation and treatment of obesity and related comorbidities. Current BMI is Body mass index is 38.51 kg/m. Lori Luna has been struggling with her weight for many years and has been unsuccessful in either losing weight, maintaining weight loss, or reaching her healthy weight goal.  Lori Luna is currently in the action stage of change and ready to dedicate time achieving and maintaining a healthier weight. Nevaya Alessandra is interested in becoming our patient and working on intensive lifestyle modifications including (but not limited to) diet and exercise for weight loss.  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 Patient is married to Lori Luna  and has children. She lives with husband and son.  Pt desires to be 180-190lbs within no desired time frame. She wants to lose weight to improve her overall health. She feels as she started gaining excess weight after childbirth, menopause, and brain surgery. She exercises rarely and either walks or uses the elliptical once a week for . She eats outside the home 3x a wk and eats fast food 1x a wk. She does grocery shop and likes to cook. She tends to crave sweets and craves food mainly in the afternoon. She tends to snack on nuts and cookies and does not skip meals. She does not snack frequently or at night. She drinks 1 mini soda a wk and drinks unsweet tea with honey, smoothies, protein drinks, and alcohol 1x a wk which consist of wine and beer. She notes that she does not have a worst food habit but has constant food noise in her head. Her PCP suggested she cut back on sugar and carbs. Pt tends to eat when stressed or for comfort and has a all or nothing personality with foods. She feels as her excess weight gains is effecting her sleep and has decreased her energy. She also feels as her mood  effects her eating about half of the time and occasionally decreases her interest in things she used to enjoy.   Subjective:   This is the patient's first visit at [facility].  The patient's NEW PATIENT PACKET that they filled out prior to today's office visit was reviewed at length and information from that paperwork was included within the following office visit note.    Included in the packet: current and past health history, medications, allergies, ROS, gynecologic history (women only), surgical history, family history, social history, weight history, weight loss surgery history (for those that have had weight loss surgery), nutritional evaluation, mood and food questionnaire along with a depression screening (PHQ9) on all patients, an Epworth questionnaire, sleep habits questionnaire, patient life and health improvement goals questionnaire. These will all be scanned into the patient's chart under the "media" tab.   Review of Systems: Please refer to new patient packet scanned into media. Pertinent positives were addressed with patient today.  Reviewed by clinician on day of visit: allergies, medications, problem list, medical history, surgical history, family history, social history, and previous encounter notes.  During the visit, I independently reviewed the patient's EKG, bioimpedance scale results, and indirect calorimeter results. I used this information to tailor a meal plan for the patient that will help Lori Luna to lose weight and will improve her obesity-related conditions going forward.  I performed a medically necessary appropriate examination and/or evaluation. I discussed the assessment and treatment plan with the patient. The patient was provided an opportunity to ask questions and all were answered. The patient agreed with the plan and demonstrated an understanding of the instructions. Labs were ordered today (unless patient declined them) and will be  reviewed with the patient at our next visit unless more critical results need to be addressed immediately. Clinical information was updated and documented in the EMR.    Objective:   PHYSICAL EXAM: Blood pressure 130/86, pulse 76, temperature 97.9 F (36.6 C), height 5' 5.5" (1.664 m), weight 235 lb (106.6 kg), last menstrual period 10/19/2018, SpO2 98%. Body mass index is 38.51 kg/m.  General: Well Developed, well nourished, and  in no acute distress.  HEENT: Normocephalic, atraumatic; EOMI, sclerae are anicteric. Skin: Warm and dry, good turgor Chest:  Normal excursion, shape, no gross ABN Respiratory: No conversational dyspnea; speaking in full sentences NeuroM-Sk:  Normal gross ROM * 4 extremities  Psych: A and O *3, insight adequate, mood- full   Anthropometric Measurements Height: 5' 5.5" (1.664 m) Weight: 235 lb (106.6 kg) BMI (Calculated): 38.5 Weight at Last Visit: na Weight Lost Since Last Visit: na Weight Gained Since Last Visit: na Starting Weight: 235lb Total Weight Loss (lbs): 0 lb (0 kg) Peak Weight: 238lb Waist Measurement : 47 inches   Body Composition  Body Fat %: 48.4 % Fat Mass (lbs): 114 lbs Muscle Mass (lbs): 115.4 lbs Total Body Water (lbs): 84.4 lbs Visceral Fat Rating : 15   Other Clinical Data Fasting: yes Labs: yes Today's Visit #: 1 Starting Date: 03/24/23 Comments: first visit    DIAGNOSTIC DATA REVIEWED:  BMET    Component Value Date/Time   NA 142 01/29/2023 1022   K 4.2 01/29/2023 1022   CL 105 01/29/2023 1022   CO2 21 01/29/2023 1022   GLUCOSE 93 01/29/2023 1022   GLUCOSE 95 07/07/2021 1320   BUN 19 01/29/2023 1022   CREATININE 0.87 01/29/2023 1022   CALCIUM 9.5 01/29/2023 1022   GFRNONAA >60 07/07/2021 1320   GFRAA 89 06/01/2019 1323   Lab Results  Component Value Date   HGBA1C 5.6 01/29/2023   HGBA1C 5.5 05/04/2019   Lab Results  Component Value Date   INSULIN 15.0 06/01/2019   Lab Results  Component Value  Date   TSH 3.230 05/04/2019   CBC    Component Value Date/Time   WBC 5.0 07/07/2021 1320   RBC 4.72 07/07/2021 1320   HGB 13.8 07/07/2021 1320   HGB 14.5 05/04/2019 1050   HCT 40.7 07/07/2021 1320   HCT 43.5 05/04/2019 1050   PLT 283 07/07/2021 1320   PLT 306 05/04/2019 1050   MCV 86.2 07/07/2021 1320   MCV 87 05/04/2019 1050   MCH 29.2 07/07/2021 1320   MCHC 33.9 07/07/2021 1320   RDW 12.5 07/07/2021 1320   RDW 12.5 05/04/2019 1050   Iron Studies No results found for: "IRON", "TIBC", "FERRITIN", "IRONPCTSAT" Lipid Panel     Component Value Date/Time   CHOL 209 (H) 01/29/2023 1022   TRIG 136 01/29/2023 1022   HDL 58 01/29/2023 1022   CHOLHDL 3.6 01/29/2023 1022   LDLCALC 127 (H) 01/29/2023 1022   Hepatic Function Panel     Component Value Date/Time   PROT 6.9 01/29/2023 1022   ALBUMIN 4.4 01/29/2023 1022   AST 21 01/29/2023 1022   ALT 32 01/29/2023 1022   ALKPHOS 84 01/29/2023 1022   BILITOT 0.3 01/29/2023 1022   BILIDIR 0.1 11/16/2009 1102   IBILI 0.8 11/16/2009 1102      Component Value Date/Time   TSH 3.230 05/04/2019 1050   Nutritional Lab Results  Component Value Date   VD25OH 29.6 (L) 05/04/2019    Attestation Statements:   I, Clinical biochemist, acting as a Stage manager for [employer], DO., have compiled all relevant documentation for today's office visit on behalf of Thomasene Lot, DO, while in the presence of [employer], DO.  Reviewed by clinician on day of visit: allergies, medications, problem list, medical history, surgical history, family history, social history, and previous encounter notes pertinent to patient's obesity diagnosis. I have spent 61 minutes in the care of the patient today including: preparing  to see patient (e.g. review and interpretation of tests, old notes ), obtaining and/or reviewing separately obtained history, performing a medically appropriate examination or evaluation, counseling and educating the patient,  ordering medications, test or procedures, documenting clinical information in the electronic or other health care record, and independently interpreting results and communicating results to the patient, family, or caregiver   I have reviewed the above documentation for accuracy and completeness, and I agree with the above. [name], D.O.  The 21st Century Cures Act was signed into law in 2016 which includes the topic of electronic health records.  This provides immediate access to information in MyChart.  This includes consultation notes, operative notes, office notes, lab results and pathology reports.  If you have any questions about what you read please let us know at your next visit so we can discuss your concerns and take corrective action if need be.  We are right here with you.

## 2023-03-26 LAB — CBC WITH DIFFERENTIAL/PLATELET
Basophils Absolute: 0 10*3/uL (ref 0.0–0.2)
Basos: 1 %
EOS (ABSOLUTE): 0.2 10*3/uL (ref 0.0–0.4)
Eos: 4 %
Hematocrit: 43.1 % (ref 34.0–46.6)
Hemoglobin: 14.3 g/dL (ref 11.1–15.9)
Immature Grans (Abs): 0 10*3/uL (ref 0.0–0.1)
Immature Granulocytes: 1 %
Lymphocytes Absolute: 1.3 10*3/uL (ref 0.7–3.1)
Lymphs: 30 %
MCH: 29 pg (ref 26.6–33.0)
MCHC: 33.2 g/dL (ref 31.5–35.7)
MCV: 87 fL (ref 79–97)
Monocytes Absolute: 0.3 10*3/uL (ref 0.1–0.9)
Monocytes: 7 %
Neutrophils Absolute: 2.4 10*3/uL (ref 1.4–7.0)
Neutrophils: 57 %
Platelets: 313 10*3/uL (ref 150–450)
RBC: 4.93 x10E6/uL (ref 3.77–5.28)
RDW: 12.9 % (ref 11.7–15.4)
WBC: 4.2 10*3/uL (ref 3.4–10.8)

## 2023-03-26 LAB — INSULIN, RANDOM: INSULIN: 18 u[IU]/mL (ref 2.6–24.9)

## 2023-03-26 LAB — FOLATE: Folate: 13.4 ng/mL (ref >3.0)

## 2023-03-26 LAB — VITAMIN D 25 HYDROXY (VIT D DEFICIENCY, FRACTURES): Vit D, 25-Hydroxy: 33 ng/mL (ref 30.0–100.0)

## 2023-03-26 LAB — TSH: TSH: 2.51 u[IU]/mL (ref 0.450–4.500)

## 2023-03-26 LAB — MAGNESIUM: Magnesium: 2.1 mg/dL (ref 1.6–2.3)

## 2023-03-26 LAB — T4, FREE: Free T4: 1.1 ng/dL (ref 0.82–1.77)

## 2023-03-26 LAB — VITAMIN B12: Vitamin B-12: 383 pg/mL (ref 232–1245)

## 2023-04-07 ENCOUNTER — Ambulatory Visit (INDEPENDENT_AMBULATORY_CARE_PROVIDER_SITE_OTHER): Payer: BC Managed Care – PPO | Admitting: Family Medicine

## 2023-04-07 ENCOUNTER — Encounter (INDEPENDENT_AMBULATORY_CARE_PROVIDER_SITE_OTHER): Payer: Self-pay | Admitting: Family Medicine

## 2023-04-07 VITALS — BP 102/68 | HR 79 | Temp 97.7°F | Ht 65.5 in | Wt 233.0 lb

## 2023-04-07 DIAGNOSIS — I10 Essential (primary) hypertension: Secondary | ICD-10-CM

## 2023-04-07 DIAGNOSIS — E88819 Insulin resistance, unspecified: Secondary | ICD-10-CM | POA: Diagnosis not present

## 2023-04-07 DIAGNOSIS — E559 Vitamin D deficiency, unspecified: Secondary | ICD-10-CM | POA: Diagnosis not present

## 2023-04-07 DIAGNOSIS — E785 Hyperlipidemia, unspecified: Secondary | ICD-10-CM | POA: Diagnosis not present

## 2023-04-07 DIAGNOSIS — E7800 Pure hypercholesterolemia, unspecified: Secondary | ICD-10-CM

## 2023-04-07 DIAGNOSIS — Z6838 Body mass index (BMI) 38.0-38.9, adult: Secondary | ICD-10-CM

## 2023-04-07 NOTE — Progress Notes (Signed)
SUBJECTIVE:  Chief Complaint: Obesity  Interim History: Patient was referred here by Cardiology for more support in her weight loss. She is on zepbound vials. She is noticing a decrease in food noise on Zepbound. Meal plan works for her so she does not need any changes to be made.  She is hoping to get thru Christmas and then make a more consistent effort.  Sugar cravings are decreasing.  Staying local for holidays coming up. Will be staying in for New Years.   Lori Luna is here to discuss her progress with her obesity treatment plan. She is on the Category 2 Plan and states she is following her eating plan approximately 50 % of the time. She states she is not exercising.   OBJECTIVE: Visit Diagnoses: Problem List Items Addressed This Visit       Cardiovascular and Mediastinum   Essential hypertension   Blood pressure well controlled today. She is on spironolactone and benicar HCT.  She is worried about her systolic BP.  Denies dizziness and lightheadedness.  She is checking her BP at home.  Patient to reach out to Dr. Duke Salvia for follow up in BP continues to be lower range of normal.        Endocrine   Insulin resistance   Pathophysiology of progression through insulin resistance to prediabetes and diabetes was discussed at length today.  Patient to continue to monitor and be in control of total intake of snack calories which may be simple carbohydrates but should be consumed only after the patient has taken in all the nutrition for the day.  Macronutrient identification, classification and daily intake ratios were discussed.  Plan to repeat labs in 3 months to monitor both hemoglobin A1c and insulin levels.  No medications at this time as patient is not having significant hunger or cravings that would make following meal plan more difficult.           Other   Hyperlipidemia   The 10-year ASCVD risk score (Arnett DK, et al., 2019) is: 2.1%   Values used to calculate the score:      Age: 57 years     Sex: Female     Is Non-Hispanic African American: No     Diabetic: No     Tobacco smoker: No     Systolic Blood Pressure: 102 mmHg     Is BP treated: Yes     HDL Cholesterol: 58 mg/dL     Total Cholesterol: 209 mg/dL       Vitamin D deficiency - Primary   Discussed importance of vitamin d supplementation.  Vitamin d supplementation has been shown to decrease fatigue, decrease risk of progression to insulin resistance and then prediabetes, decreases risk of falling in older age and can even assist in decreasing depressive symptoms in PTSD.   Prescription for Vitamin D sent in.        Other Visit Diagnoses       Morbid obesity (HCC) -starting BMI 03/24/23-38.5         BMI 38.0-38.9,adult           No data recorded No data recorded  No data recorded No data recorded    ASSESSMENT AND PLAN:  Diet: Lamaria is currently in the action stage of change. As such, her goal is to continue with weight loss efforts. She has agreed to Category 2 Plan.  Exercise: Rosheda has been instructed that some exercise is better than none for weight loss and overall health  benefits.   Behavior Modification:  We discussed the following Behavioral Modification Strategies today: increasing lean protein intake, increasing vegetables, meal planning and cooking strategies, better snacking choices, and holiday eating strategies. We discussed various medication options to help [facility] with her weight loss efforts and we both agreed to continue Zepbound at current dose.  No follow-ups on file.Marland Kitchen She was informed of the importance of frequent follow up visits to maximize her success with intensive lifestyle modifications for her multiple health conditions.  Attestation Statements:   Reviewed by clinician on day of visit: allergies, medications, problem list, medical history, surgical history, family history, social history, and previous encounter notes.   Time spent on visit  including pre-visit chart review and post-visit care and charting was 40 minutes.    Reuben Likes, MD

## 2023-04-07 NOTE — Assessment & Plan Note (Signed)
 Discussed importance of vitamin d supplementation.  Vitamin d supplementation has been shown to decrease fatigue, decrease risk of progression to insulin resistance and then prediabetes, decreases risk of falling in older age and can even assist in decreasing depressive symptoms in PTSD.   Prescription for Vitamin D sent in.

## 2023-04-07 NOTE — Assessment & Plan Note (Signed)
Blood pressure well controlled today. She is on spironolactone and benicar HCT.  She is worried about her systolic BP.  Denies dizziness and lightheadedness.  She is checking her BP at home.  Patient to reach out to Dr. Duke Salvia for follow up in BP continues to be lower range of normal.

## 2023-04-07 NOTE — Assessment & Plan Note (Signed)

## 2023-04-07 NOTE — Progress Notes (Deleted)
SUBJECTIVE:  Chief Complaint: Obesity  Interim History: Patient was referred here by Cardiology for more support in her weight loss. She is on zepbound vials. She is noticing a decrease in food noise on Zepbound. Meal plan works for her so she does not need any changes to be made.  She is hoping to get thru Christmas and then make a more consistent effort.  Sugar cravings are decreasing.  Staying local for holidays coming up. Will be staying in for New Years.   Lori Luna is here to discuss her progress with her obesity treatment plan. She is on the Category 2 Plan and states she is following her eating plan approximately 50 % of the time. She states she is not exercising.   OBJECTIVE: Visit Diagnoses: Problem List Items Addressed This Visit       Cardiovascular and Mediastinum   Essential hypertension   Blood pressure well controlled today. She is on spironolactone and benicar HCT.  She is worried about her systolic BP.  Denies dizziness and lightheadedness.  She is checking her BP at home.  Patient to reach out to Dr. Duke Salvia for follow up in BP continues to be lower range of normal.        Endocrine   Insulin resistance   Pathophysiology of progression through insulin resistance to prediabetes and diabetes was discussed at length today.  Patient to continue to monitor and be in control of total intake of snack calories which may be simple carbohydrates but should be consumed only after the patient has taken in all the nutrition for the day.  Macronutrient identification, classification and daily intake ratios were discussed.  Plan to repeat labs in 3 months to monitor both hemoglobin A1c and insulin levels.  No medications at this time as patient is not having significant hunger or cravings that would make following meal plan more difficult.           Other   Hyperlipidemia   The 10-year ASCVD risk score (Arnett DK, et al., 2019) is: 2.1%   Values used to calculate the score:      Age: 57 years     Sex: Female     Is Non-Hispanic African American: No     Diabetic: No     Tobacco smoker: No     Systolic Blood Pressure: 102 mmHg     Is BP treated: Yes     HDL Cholesterol: 58 mg/dL     Total Cholesterol: 209 mg/dL       Vitamin D deficiency - Primary   Discussed importance of vitamin d supplementation.  Vitamin d supplementation has been shown to decrease fatigue, decrease risk of progression to insulin resistance and then prediabetes, decreases risk of falling in older age and can even assist in decreasing depressive symptoms in PTSD.   Prescription for Vitamin D sent in.        Other Visit Diagnoses       Morbid obesity (HCC) -starting BMI 03/24/23-38.5         BMI 38.0-38.9,adult           No data recorded No data recorded  No data recorded No data recorded    ASSESSMENT AND PLAN:  Diet: Lamaria is currently in the action stage of change. As such, her goal is to continue with weight loss efforts. She has agreed to Category 2 Plan.  Exercise: Rosheda has been instructed that some exercise is better than none for weight loss and overall health  benefits.   Behavior Modification:  We discussed the following Behavioral Modification Strategies today: increasing lean protein intake, increasing vegetables, meal planning and cooking strategies, better snacking choices, and holiday eating strategies. We discussed various medication options to help [facility] with her weight loss efforts and we both agreed to continue Zepbound at current dose.  No follow-ups on file.Marland Kitchen She was informed of the importance of frequent follow up visits to maximize her success with intensive lifestyle modifications for her multiple health conditions.  Attestation Statements:   Reviewed by clinician on day of visit: allergies, medications, problem list, medical history, surgical history, family history, social history, and previous encounter notes.   Time spent on visit  including pre-visit chart review and post-visit care and charting was 40 minutes.    Reuben Likes, MD

## 2023-04-07 NOTE — Assessment & Plan Note (Signed)
The 10-year ASCVD risk score (Arnett DK, et al., 2019) is: 2.1%   Values used to calculate the score:     Age: 57 years     Sex: Female     Is Non-Hispanic African American: No     Diabetic: No     Tobacco smoker: No     Systolic Blood Pressure: 102 mmHg     Is BP treated: Yes     HDL Cholesterol: 58 mg/dL     Total Cholesterol: 209 mg/dL

## 2023-04-08 DIAGNOSIS — M549 Dorsalgia, unspecified: Secondary | ICD-10-CM | POA: Diagnosis not present

## 2023-04-08 DIAGNOSIS — R0683 Snoring: Secondary | ICD-10-CM | POA: Diagnosis not present

## 2023-04-08 DIAGNOSIS — G4733 Obstructive sleep apnea (adult) (pediatric): Secondary | ICD-10-CM | POA: Diagnosis not present

## 2023-04-19 ENCOUNTER — Other Ambulatory Visit: Payer: Self-pay | Admitting: Cardiovascular Disease

## 2023-05-10 ENCOUNTER — Encounter (INDEPENDENT_AMBULATORY_CARE_PROVIDER_SITE_OTHER): Payer: Self-pay | Admitting: Family Medicine

## 2023-05-10 ENCOUNTER — Ambulatory Visit (INDEPENDENT_AMBULATORY_CARE_PROVIDER_SITE_OTHER): Payer: BC Managed Care – PPO | Admitting: Family Medicine

## 2023-05-10 VITALS — BP 106/71 | HR 74 | Temp 98.0°F | Ht 65.5 in | Wt 225.0 lb

## 2023-05-10 DIAGNOSIS — Z6838 Body mass index (BMI) 38.0-38.9, adult: Secondary | ICD-10-CM

## 2023-05-10 DIAGNOSIS — I10 Essential (primary) hypertension: Secondary | ICD-10-CM | POA: Diagnosis not present

## 2023-05-10 DIAGNOSIS — G4733 Obstructive sleep apnea (adult) (pediatric): Secondary | ICD-10-CM | POA: Diagnosis not present

## 2023-05-10 DIAGNOSIS — E88819 Insulin resistance, unspecified: Secondary | ICD-10-CM

## 2023-05-10 DIAGNOSIS — Z6836 Body mass index (BMI) 36.0-36.9, adult: Secondary | ICD-10-CM

## 2023-05-10 NOTE — Progress Notes (Signed)
Lori Luna, D.O.  ABFM, ABOM Specializing in Clinical Bariatric Medicine  Office located at: [address] [place], [place]  [address]   Assessment and Plan:   FOR THE DISEASE OF OBESITY: Since last office visit on 04/07/23 patient's muscle mass has decreased by 1 lb. Fat mass has decreased by 7 lb. Total body water has decreased by 6.2 lb.  Counseling done on how various foods will affect these numbers and how to maximize success  Total lbs lost to date: 10 lbs  Total weight loss percentage to date: 4.26%    Recommended Dietary Goals Lori Luna is currently in the action stage of change. As such, her goal is to continue weight management plan.  Pt will start journaling 1450-1550 calories and 110+ grams protein daily with Category 2 meal plan with 6 ounces of lean protein at lunch and 8 ounces at dinner as a guide so she can have more flexibility with food choices.    Behavioral Intervention We discussed the following today: work on tracking and journaling calories using tracking application and focusing on food with a 10:1 ratio of calories: grams of protein  Additional resources provided today:  handout on journaling log  Evidence-based interventions for health behavior change were utilized today including the discussion of self monitoring techniques, problem-solving barriers and SMART goal setting techniques.   Regarding patient's less desirable eating habits and patterns, we employed the technique of small changes.   Pt will specifically work on: n/a   Recommended Physical Activity Goals Lori Luna has been advised to work up to 150 minutes of moderate intensity aerobic activity a week and strengthening exercises 2-3 times per week for cardiovascular health, weight loss maintenance and preservation of muscle mass.   She has agreed to : Continue current level of physical activity    Pharmacotherapy We both agreed to : continue with nutritional and behavioral  strategies and continue current anti-obesity medication regimen   FOR ASSOCIATED CONDITIONS ADDRESSED TODAY:  BMI 38.0-38.9,adult - current BMI 36.86 Morbid obesity (HCC) -starting BMI 03/24/23-38.5 Assessment & Plan: Pt is on Tirzepatide 5 mg weekly per her cardiologist. Started prior to starting with [name]. Getting from "Pharm D there. " does not whish to make any changes with who prescribes medication.     She feels that the Tirzepatide has been working very well with controlling her hunger, cravings, and intrusive thoughts about food. She endorses experiencing slight nausea from the Tirzepatide, which has been tolerable. Continue GLP-1 therapy. Continue to working on getting in all proteins and decreasing simple carbs, and trans/saturated fats. Pt reminded to maintain adequate hydration.   OSA (obstructive sleep apnea) Assessment & Plan:  Managed by Lori Luna  She has using CPAP more and is tolerating well Sleeping only 4-5 hrs  Pt is taking bendryl OTC.   I reccemend discontinuion of bendyrl due to side effect profile & discuss that there are alternative, pt decline to go on perscirption sleep medicine at this time. Can dicuss further with PCP.  Continue with weight loss and CPAP    Essential hypertension ***       Essential hypertension Assessment & Plan: Last 3 blood pressure readings in our office are as follows: BP Readings from Last 3 Encounters:  05/10/23 106/71  04/07/23 102/68  03/24/23 130/86   HTN treated with Benicar HCTZ. Pt stopped taking the Aldactone on her own.   One day 96 systolic she stopped aldactone because experiencing dizziness. Symptoms have improved. High recommend she discuss  with cardiologist.   Upper 90s-100s systolic has been running after discotninuing aldvactone   Blood pressure stable today.   She feels    Follow up:   No follow-ups on file. She was informed of the importance of frequent follow up visits to maximize her success  with intensive lifestyle modifications for her multiple health conditions.  Subjective:   Chief complaint: Obesity Lori Luna is here to discuss her progress with her obesity treatment plan. She is on the Category 2 meal plan with 6 ounces of lean protein at lunch and 8 ounces at dinner as a guide and states she is following her eating plan approximately 85% of the time. She states she is doing the seated elliptical and treadmill 45 minutes, 2 days a week.   Interval History:  Lori Luna is here for a follow up office visit. Since last OV on 04/07/23, Lori Luna is 8 lbs down.  Has questions about foods like avocados, olive oil, protein cereals    Pharmacotherapy for weight loss: She is currently taking  Tirzepatide 5 mg weekly  .   Review of Systems:  Pertinent positives were addressed with patient today.  Reviewed by clinician on day of visit: allergies, medications, problem list, medical history, surgical history, family history, social history, and previous encounter notes.  Weight Summary and Biometrics   Weight Lost Since Last Visit: 8 lb  Weight Gained Since Last Visit: 0  Vitals Temp: 98 F (36.7 C) BP: 106/71 Pulse Rate: 74 SpO2: 100 %   Anthropometric Measurements Height: 5' 5.5" (1.664 m) Weight: 225 lb (102.1 kg) BMI (Calculated): 36.86 Weight at Last Visit: 233 lb Weight Lost Since Last Visit: 8 lb Weight Gained Since Last Visit: 0 Starting Weight: 235 lb Total Weight Loss (lbs): 10 lb (4.536 kg) Peak Weight: 238 lb   Body Composition  Body Fat %: 45.7 % Fat Mass (lbs): 103 lbs Muscle Mass (lbs): 116 lbs Total Body Water (lbs): 80.6 lbs Visceral Fat Rating : 13   No data recorded  Objective:   PHYSICAL EXAM: Blood pressure 106/71, pulse 74, temperature 98 F (36.7 C), height 5' 5.5" (1.664 m), weight 225 lb (102.1 kg), last menstrual period 10/19/2018, SpO2 100%. Body mass index is 36.87 kg/m.  General: she is overweight, cooperative  and in no acute distress. PSYCH: Has normal mood, affect and thought process.   HEENT: EOMI, sclerae are anicteric. Lungs: Normal breathing effort, no conversational dyspnea. Extremities: Moves * 4 Neurologic: A and O * 3, good insight  DIAGNOSTIC DATA REVIEWED: BMET    Component Value Date/Time   NA 142 01/29/2023 1022   K 4.2 01/29/2023 1022   CL 105 01/29/2023 1022   CO2 21 01/29/2023 1022   GLUCOSE 93 01/29/2023 1022   GLUCOSE 95 07/07/2021 1320   BUN 19 01/29/2023 1022   CREATININE 0.87 01/29/2023 1022   CALCIUM 9.5 01/29/2023 1022   GFRNONAA >60 07/07/2021 1320   GFRAA 89 06/01/2019 1323   Lab Results  Component Value Date   HGBA1C 5.6 01/29/2023   HGBA1C 5.5 05/04/2019   Lab Results  Component Value Date   INSULIN 18.0 03/24/2023   INSULIN 15.0 06/01/2019   Lab Results  Component Value Date   TSH 2.510 03/24/2023   CBC    Component Value Date/Time   WBC 4.2 03/24/2023 1024   WBC 5.0 07/07/2021 1320   RBC 4.93 03/24/2023 1024   RBC 4.72 07/07/2021 1320   HGB 14.3 03/24/2023 1024   HCT 43.1  03/24/2023 1024   PLT 313 03/24/2023 1024   MCV 87 03/24/2023 1024   MCH 29.0 03/24/2023 1024   MCH 29.2 07/07/2021 1320   MCHC 33.2 03/24/2023 1024   MCHC 33.9 07/07/2021 1320   RDW 12.9 03/24/2023 1024   Iron Studies No results found for: "IRON", "TIBC", "FERRITIN", "IRONPCTSAT" Lipid Panel     Component Value Date/Time   CHOL 209 (H) 01/29/2023 1022   TRIG 136 01/29/2023 1022   HDL 58 01/29/2023 1022   CHOLHDL 3.6 01/29/2023 1022   LDLCALC 127 (H) 01/29/2023 1022   Hepatic Function Panel     Component Value Date/Time   PROT 6.9 01/29/2023 1022   ALBUMIN 4.4 01/29/2023 1022   AST 21 01/29/2023 1022   ALT 32 01/29/2023 1022   ALKPHOS 84 01/29/2023 1022   BILITOT 0.3 01/29/2023 1022   BILIDIR 0.1 11/16/2009 1102   IBILI 0.8 11/16/2009 1102      Component Value Date/Time   TSH 2.510 03/24/2023 1024   Nutritional Lab Results  Component Value  Date   VD25OH 33.0 03/24/2023   VD25OH 29.6 (L) 05/04/2019    Attestations:   I, Special Puri, acting as a Stage manager for Thomasene Lot, DO., have compiled all relevant documentation for today's office visit on behalf of Thomasene Lot, DO, while in the presence of [employer], DO.  Reviewed by clinician on day of visit: allergies, medications, problem list, medical history, surgical history, family history, social history, and previous encounter notes pertinent to patient's obesity diagnosis. I have spent 40 minutes in the care of the patient today including: preparing to see patient (e.g. review and interpretation of tests, old notes ), obtaining and/or reviewing separately obtained history, performing a medically appropriate examination or evaluation, counseling and educating the patient, ordering medications, test or procedures, documenting clinical information in the electronic or other health care record, and independently interpreting results and communicating results to the patient, family, or caregiver   I have reviewed the above documentation for accuracy and completeness, and I agree with the above. Lori Luna, D.O.  The 21st Century Cures Act was signed into law in 2016 which includes the topic of electronic health records.  This provides immediate access to information in MyChart.  This includes consultation notes, operative notes, office notes, lab results and pathology reports.  If you have any questions about what you read please let us know at your next visit so we can discuss your concerns and take corrective action if need be.  We are right here with you.

## 2023-06-01 ENCOUNTER — Ambulatory Visit (HOSPITAL_BASED_OUTPATIENT_CLINIC_OR_DEPARTMENT_OTHER): Payer: BC Managed Care – PPO | Admitting: Obstetrics & Gynecology

## 2023-06-02 ENCOUNTER — Ambulatory Visit (HOSPITAL_BASED_OUTPATIENT_CLINIC_OR_DEPARTMENT_OTHER): Payer: BC Managed Care – PPO | Admitting: Obstetrics & Gynecology

## 2023-06-07 ENCOUNTER — Ambulatory Visit (INDEPENDENT_AMBULATORY_CARE_PROVIDER_SITE_OTHER): Payer: BC Managed Care – PPO | Admitting: Family Medicine

## 2023-06-07 ENCOUNTER — Encounter (INDEPENDENT_AMBULATORY_CARE_PROVIDER_SITE_OTHER): Payer: Self-pay | Admitting: Family Medicine

## 2023-06-07 DIAGNOSIS — I10 Essential (primary) hypertension: Secondary | ICD-10-CM | POA: Diagnosis not present

## 2023-06-07 DIAGNOSIS — E88819 Insulin resistance, unspecified: Secondary | ICD-10-CM | POA: Diagnosis not present

## 2023-06-07 DIAGNOSIS — G4733 Obstructive sleep apnea (adult) (pediatric): Secondary | ICD-10-CM

## 2023-06-07 DIAGNOSIS — Z6838 Body mass index (BMI) 38.0-38.9, adult: Secondary | ICD-10-CM

## 2023-06-07 DIAGNOSIS — Z6837 Body mass index (BMI) 37.0-37.9, adult: Secondary | ICD-10-CM

## 2023-06-07 NOTE — Progress Notes (Signed)
Lori Luna, D.O.  ABFM, ABOM Specializing in Clinical Bariatric Medicine  Office located at: [address] [place], [place]  [address]   Assessment and Plan:   FOR THE DISEASE OF OBESITY: BMI 38.0-38.9,adult - current BMI 37.94 Morbid obesity (HCC) -starting BMI 03/24/23-38.5 Assessment & Plan: Since last office visit on 05/10/2023 patient's muscle mass has decreased by 1.4 lbs. Fat mass has decreased by 0.2 lbs. Total body water has increased by 0.2 lbs.  Counseling done on how various foods will affect these numbers and how to maximize success  Total lbs lost to date: 12 lbs Total weight loss percentage to date: -5.11%    Recommended Dietary Goals Lori Luna is currently in the action stage of change. As such, her goal is to continue weight management plan.  She has agreed to:  Journal (with no calorie or protein parameters) using CAT 2 MP as a guide.  Pt previously struggled with journaling 1450-1550 calories and 110+ grams protein daily with Category 2 meal plan as a guide with 6 ounces of lean protein at lunch and 8 ounces at dinner -- As a result, she was not given a calorie/protein goal with journaling plan today.   Behavioral Intervention We discussed the following today: increasing lean protein intake to established goals, decreasing simple carbohydrates , work on tracking and journaling calories using tracking application, practice mindfulness eating and understand the difference between hunger signals and cravings, and focusing on food with a 10:1 ratio of calories: grams of protein. Recommended book called Atomic Habits.  Additional resources provided today:  Handout on food journaling log  Evidence-based interventions for health behavior change were utilized today including the discussion of self monitoring techniques, problem-solving barriers and SMART goal setting techniques.   Regarding patient's less desirable eating habits and patterns, we employed the  technique of small changes.   Pt will specifically work on: Start consistently journaling calorie and protein intake daily and getting 7-9 hours of sleep per day for next visit.    Recommended Physical Activity Goals Ameka has been advised to work up to 150 minutes of moderate intensity aerobic activity a week and strengthening exercises 2-3 times per week for cardiovascular health, weight loss maintenance and preservation of muscle mass.   She has agreed to :  Think about enjoyable ways to increase daily physical activity and overcoming barriers to exercise and Increase physical activity in their day and reduce sedentary time (increase NEAT).   Pharmacotherapy We both agreed to: continue with nutritional and behavioral strategies and adequate clinical response to current dose, continue current regimen   FOR ASSOCIATED CONDITIONS ADDRESSED TODAY: Insulin resistance Assessment & Plan: Lab Results  Component Value Date   HGBA1C 5.6 01/29/2023   HGBA1C 5.5 05/04/2019   INSULIN 18.0 03/24/2023   INSULIN 15.0 06/01/2019    Pt is compliant with Tirzepatide 5 mg weekly. Denies any GI issues or other intolerances/SE. Hunger/cravings well controlled. Pt is interested in possibly increasing her Tirzepatide.   Discussed possible side effects with medications when not eating on plan. I recommend we hold on increasing Tirzepatide in an effort to avoid potential side effects until pt is eating on plan consistently. Reviewed how journaling will increase mindfulness. Advised pt to journal her daily intake consistently, with no calorie/protein parameters, and bring in her log to her next OV so we can see what she is eating prior to increasing Tirzepatide. Encouraged pt to increase her protein intake and be mindful of 10:1 ratio of  calories: g of protein. Will continue to monitor condition alongside PCP.    Essential hypertension Assessment & Plan: BP Readings from Last 3 Encounters:  06/09/23  119/76  06/07/23 107/71  05/10/23 106/71   BP today is at goal. Pt is compliant with Benicar 40-12.5 mg once daily. Tolerating well, no SE. Pt reports stopping Aldactone prior to the New Year.  She has been monitoring her BP at home and reports her readings have been within normal range at home.   Continue with current antihypertensive treatment as prescribed. No med changes made today. Advised pt to continue monitoring her BP at home. Will continue monitoring condition alongside PCP/specialists as it relates to her weight loss.    OSA (obstructive sleep apnea) on CPAP Assessment & Plan: Neko is currently on CPAP therapy with good tolerance. Her sleep quality and duration has improved since starting CPAP nightly. She does note that sleep quality is effected with increased stress. Pt only takes Benadryl on occasion.   I recommend pt aim for 7-9 hours of sleep per night. Continue current regimen. Will monitor condition as it relates to her weight loss.   Follow up:   Return in 3 weeks (on 06/28/2023). She was informed of the importance of frequent follow up visits to maximize her success with intensive lifestyle modifications for her multiple health conditions.  Subjective:   Chief complaint: Obesity [name] is here to discuss her progress with her obesity treatment plan. She is journaling 1450-1550 calories and 110+ grams protein daily with Category 2 meal plan as a guide with 6 ounces of lean protein at lunch and 8 ounces at dinner and states she is following her eating plan approximately 90% of the time. She states she is not exercising.  Interval History:  Lori Luna is here for a follow up office visit. Since last OV on 05/10/2023, she has lost 2 lbs. Pt reports she physically felt her progress with weight loss more prior to starting medications. Pt stopped drinking protein shakes daily to decrease her daily caloric intake. Has not been journaling consistently.    Pharmacotherapy for weight loss: She is currently taking Tirzepatide 5 mg weekly.  Review of Systems:  Pertinent positives were addressed with patient today.  Reviewed by clinician on day of visit: allergies, medications, problem list, medical history, surgical history, family history, social history, and previous encounter notes.  Weight Summary and Biometrics   Weight Lost Since Last Visit: 2lb  Weight Gained Since Last Visit: 0    Vitals Temp: 97.8 F (36.6 C) BP: 107/71 Pulse Rate: 71 SpO2: 98 %   Anthropometric Measurements Height: 5' 5.5" (1.664 m) Weight: 223 lb (101.2 kg) BMI (Calculated): 36.53 Weight at Last Visit: 225lb Weight Lost Since Last Visit: 2lb Weight Gained Since Last Visit: 0 Starting Weight: 235lb Total Weight Loss (lbs): 12 lb (5.443 kg) Peak Weight: 238lb   Body Composition  Body Fat %: 46 % Fat Mass (lbs): 102.8 lbs Muscle Mass (lbs): 114.6 lbs Total Body Water (lbs): 80.8 lbs Visceral Fat Rating : 13   Other Clinical Data Fasting: yes Labs: no Today's Visit #: 4 Starting Date: 03/24/23    Objective:   PHYSICAL EXAM: Blood pressure 107/71, pulse 71, temperature 97.8 F (36.6 C), height 5' 5.5" (1.664 m), weight 223 lb (101.2 kg), last menstrual period 10/19/2018, SpO2 98%. Body mass index is 36.54 kg/m.  General: she is overweight, cooperative and in no acute distress. PSYCH: Has normal mood, affect and thought process.  HEENT: EOMI, sclerae are anicteric. Lungs: Normal breathing effort, no conversational dyspnea. Extremities: Moves * 4 Neurologic: A and O * 3, good insight  DIAGNOSTIC DATA REVIEWED: BMET    Component Value Date/Time   NA 142 01/29/2023 1022   K 4.2 01/29/2023 1022   CL 105 01/29/2023 1022   CO2 21 01/29/2023 1022   GLUCOSE 93 01/29/2023 1022   GLUCOSE 95 07/07/2021 1320   BUN 19 01/29/2023 1022   CREATININE 0.87 01/29/2023 1022   CALCIUM 9.5 01/29/2023 1022   GFRNONAA >60 07/07/2021 1320    GFRAA 89 06/01/2019 1323   Lab Results  Component Value Date   HGBA1C 5.6 01/29/2023   HGBA1C 5.5 05/04/2019   Lab Results  Component Value Date   INSULIN 18.0 03/24/2023   INSULIN 15.0 06/01/2019   Lab Results  Component Value Date   TSH 2.510 03/24/2023   CBC    Component Value Date/Time   WBC 4.2 03/24/2023 1024   WBC 5.0 07/07/2021 1320   RBC 4.93 03/24/2023 1024   RBC 4.72 07/07/2021 1320   HGB 14.3 03/24/2023 1024   HCT 43.1 03/24/2023 1024   PLT 313 03/24/2023 1024   MCV 87 03/24/2023 1024   MCH 29.0 03/24/2023 1024   MCH 29.2 07/07/2021 1320   MCHC 33.2 03/24/2023 1024   MCHC 33.9 07/07/2021 1320   RDW 12.9 03/24/2023 1024   Iron Studies No results found for: "IRON", "TIBC", "FERRITIN", "IRONPCTSAT" Lipid Panel     Component Value Date/Time   CHOL 209 (H) 01/29/2023 1022   TRIG 136 01/29/2023 1022   HDL 58 01/29/2023 1022   CHOLHDL 3.6 01/29/2023 1022   LDLCALC 127 (H) 01/29/2023 1022   Hepatic Function Panel     Component Value Date/Time   PROT 6.9 01/29/2023 1022   ALBUMIN 4.4 01/29/2023 1022   AST 21 01/29/2023 1022   ALT 32 01/29/2023 1022   ALKPHOS 84 01/29/2023 1022   BILITOT 0.3 01/29/2023 1022   BILIDIR 0.1 11/16/2009 1102   IBILI 0.8 11/16/2009 1102      Component Value Date/Time   TSH 2.510 03/24/2023 1024   Nutritional Lab Results  Component Value Date   VD25OH 33.0 03/24/2023   VD25OH 29.6 (L) 05/04/2019    Attestations:   Burnett Sheng, acting as a medical scribe for Thomasene Lot, DO., have compiled all relevant documentation for today's office visit on behalf of Thomasene Lot, DO, while in the presence of [employer], DO.  Reviewed by clinician on day of visit: allergies, medications, problem list, medical history, surgical history, family history, social history, and previous encounter notes pertinent to patient's obesity diagnosis.  I have reviewed the above documentation for accuracy and completeness, and I  agree with the above. Lori Luna, D.O.  The 21st Century Cures Act was signed into law in 2016 which includes the topic of electronic health records.  This provides immediate access to information in MyChart.  This includes consultation notes, operative notes, office notes, lab results and pathology reports.  If you have any questions about what you read please let us know at your next visit so we can discuss your concerns and take corrective action if need be.  We are right here with you.

## 2023-06-09 ENCOUNTER — Encounter (HOSPITAL_BASED_OUTPATIENT_CLINIC_OR_DEPARTMENT_OTHER): Payer: Self-pay | Admitting: Obstetrics & Gynecology

## 2023-06-09 ENCOUNTER — Ambulatory Visit (HOSPITAL_BASED_OUTPATIENT_CLINIC_OR_DEPARTMENT_OTHER): Payer: BC Managed Care – PPO | Admitting: Obstetrics & Gynecology

## 2023-06-09 ENCOUNTER — Encounter: Payer: Self-pay | Admitting: Pharmacist Clinician (PhC)/ Clinical Pharmacy Specialist

## 2023-06-09 VITALS — BP 119/76 | HR 71 | Ht 65.0 in | Wt 228.0 lb

## 2023-06-09 DIAGNOSIS — Z780 Asymptomatic menopausal state: Secondary | ICD-10-CM | POA: Diagnosis not present

## 2023-06-09 DIAGNOSIS — N952 Postmenopausal atrophic vaginitis: Secondary | ICD-10-CM

## 2023-06-09 DIAGNOSIS — Z01419 Encounter for gynecological examination (general) (routine) without abnormal findings: Secondary | ICD-10-CM | POA: Diagnosis not present

## 2023-06-09 MED ORDER — ESTRADIOL 0.1 MG/GM VA CREA: TOPICAL_CREAM | VAGINAL | 1 refills | Status: AC

## 2023-06-09 NOTE — Progress Notes (Signed)
ANNUAL EXAM Patient name: Lori Luna MRN [id number]  Date of birth: October 13, 1965 Chief Complaint:   Annual Exam  History of Present Illness:   Lori Luna is a 58 y.o. G36P3003 Caucasian female being seen today for a routine annual exam.  Has recently started Zepbound.  This is ordered by Dr. Duke Salvia.  Has lost about 10 pounds.    Denies vaginal bleeding.  Reports painful intercourse.    Had acoustic neuroma removal.  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  Did have some balance issues.  Had follow up last year.  This was good.  He recommended 5 year follow up.    Patient's last menstrual period was 10/19/2018 (exact date).   Last pap 07/02/2021. Results were: NILM w/ HRHPV negative. H/O abnormal pap: no Last mammogram: 01/04/2023. Results were: normal. Family h/o breast cancer: no Last colonoscopy: 11/03/2022. Results were: normal. Follow up 5 years.  Family h/o colorectal cancer: yes - paternal GM     06/09/2023   11:05 AM 03/11/2023   10:06 AM 08/12/2021    3:49 PM 07/02/2021   11:02 AM 02/11/2021    3:50 PM  Depression screen PHQ 2/9  Decreased Interest 0 0 0 0 0  Down, Depressed, Hopeless 0 0 0 0 0  PHQ - 2 Score 0 0 0 0 0     Review of Systems:   Pertinent items are noted in HPI  Denies any headaches, blurred vision, fatigue, shortness of breath, chest pain, abdominal pain, abnormal vaginal discharge/itching/odor/irritation, bowel movements, urination.  Pertinent History Reviewed:  Reviewed past medical,surgical, social and family history.   Reviewed problem list, medications and allergies. Physical Assessment:   Vitals:   06/09/23 1103  BP: 119/76  Pulse: 71  Weight: 228 lb (103.4 kg)  Height: 5\' 5"  (1.651 m)  Body mass index is 37.94 kg/m.        Physical Examination:   General appearance - well appearing, and in no distress  Mental status - alert, oriented to person, place, and time  Psych:  She has a normal mood and affect  Skin -  warm and dry, normal color, no suspicious lesions noted  Chest - effort normal, all lung fields clear to auscultation bilaterally  Heart - normal rate and regular rhythm  Neck:  midline trachea, no thyromegaly or nodules  Breasts - breasts appear normal, no suspicious masses, no skin or nipple changes or  axillary nodes  Abdomen - soft, nontender, nondistended, no masses or organomegaly  Pelvic - VULVA: normal appearing vulva with no masses, tenderness or lesions   VAGINA: normal appearing vagina with normal color and discharge, no lesions   CERVIX: normal appearing cervix without discharge or lesions, no CMT  Thin prep pap not obtained today  UTERUS: uterus is felt to be normal size, shape, consistency and nontender   ADNEXA: No adnexal masses or tenderness noted.  Rectal - normal rectal, good sphincter tone, no masses felt.  Extremities:  No swelling or varicosities noted  Chaperone present for exam  Assessment & Plan:  1. Well woman exam with routine gynecological exam (Primary) - Pap smear 2022.  Not indicated today. - Mammogram 01/05/2023 - Colonoscopy 2024.  Follow up 5 years. - Bone mineral density not indicated at this time. - lab work done with PCP, Dr. Uvaldo Rising - vaccines reviewed/updated  2. Postmenopausal  3. Vaginal atrophy - estradiol (ESTRACE) 0.1 MG/GM vaginal cream; 1 gram vaginally twice weekly  Dispense: 42.5 g; Refill: 1  Meds:  Meds ordered this encounter  Medications   estradiol (ESTRACE) 0.1 MG/GM vaginal cream    Sig: 1 gram vaginally twice weekly    Dispense:  42.5 g    Refill:  1    Follow-up: Return in about 3 months (around 09/06/2023) for recheck after starting estradiol.  Jerene Bears, MD 06/09/2023 11:50 AM

## 2023-06-30 ENCOUNTER — Encounter (INDEPENDENT_AMBULATORY_CARE_PROVIDER_SITE_OTHER): Payer: Self-pay | Admitting: Family Medicine

## 2023-06-30 ENCOUNTER — Ambulatory Visit (INDEPENDENT_AMBULATORY_CARE_PROVIDER_SITE_OTHER): Payer: BC Managed Care – PPO | Admitting: Family Medicine

## 2023-06-30 VITALS — BP 101/65 | HR 78 | Temp 98.0°F | Ht 65.0 in | Wt 219.0 lb

## 2023-06-30 DIAGNOSIS — Z6838 Body mass index (BMI) 38.0-38.9, adult: Secondary | ICD-10-CM

## 2023-06-30 DIAGNOSIS — E559 Vitamin D deficiency, unspecified: Secondary | ICD-10-CM | POA: Diagnosis not present

## 2023-06-30 DIAGNOSIS — E88819 Insulin resistance, unspecified: Secondary | ICD-10-CM | POA: Diagnosis not present

## 2023-06-30 DIAGNOSIS — I10 Essential (primary) hypertension: Secondary | ICD-10-CM

## 2023-06-30 DIAGNOSIS — Z6836 Body mass index (BMI) 36.0-36.9, adult: Secondary | ICD-10-CM

## 2023-06-30 NOTE — Progress Notes (Signed)
 Lori Luna, D.O.  ABFM, ABOM Specializing in Clinical Bariatric Medicine  Office located at: [address] [place], [place]  [address]   Assessment and Plan:  Will recheck Vit D, insulin, A1c, and B12 next OV.  FOR THE DISEASE OF OBESITY: BMI 38.0-38.9,adult - current BMI 36.44 Morbid obesity (HCC) -starting BMI 03/24/23-38.5 Assessment & Plan: Since last office visit on 06/07/2023, patient's muscle mass has decreased by 0.4 lbs. Fat mass has decreased by 3.4 lbs. Total body water has decreased by 1.4 lbs.  Counseling done on how various foods will affect these numbers and how to maximize success  Total lbs lost to date: 16 lbs Total weight loss percentage to date: 6.81%   Lori Luna is on Tirzepatide 5 mg weekly for her obesity. Tolerating meds well, hunger/cravings well controlled. Pt reports she is increasing dose to 7.5 mg, via her cardiologist.   Recommended Dietary Goals Lori Luna is currently in the action stage of change. As such, her goal is to continue weight management plan.  She has agreed to: continue current plan   Behavioral Intervention We discussed the following today: Non-meat protein options (e.g. cottages cheese, edamame spaghetti), focusing on healthy fats, increasing lean protein intake to established goals  Additional resources provided today:  Handout on healthy eating principles, handout on non-starchy vegetables,     Evidence-based interventions for health behavior change were utilized today including the discussion of self monitoring techniques, problem-solving barriers and SMART goal setting techniques.   Regarding patient's less desirable eating habits and patterns, we employed the technique of small changes.   Pt will specifically work on: n/a   Recommended Physical Activity Goals Lori Luna has been advised to work up to 150 minutes of moderate intensity aerobic activity a week and strengthening exercises 2-3 times per week for  cardiovascular health, weight loss maintenance and preservation of muscle mass.   She has agreed to :  Think about enjoyable ways to increase daily physical activity and overcoming barriers to exercise   Pharmacotherapy We both agreed to : continue with nutritional and behavioral strategies and continue current medication regimen   FOR ASSOCIATED CONDITIONS ADDRESSED TODAY:  Insulin resistance Assessment & Plan: Lori Luna is not on any medications for IR. Diet controled.Educated pt that for women if body fat is over 35%, you are at risk for cancer, heart disease, and diabetes. Continue to follow prudent nutrition plan and increase consistent exercise to help promote weight loss. Will continue to monitor alongside cardiologist.    Essential hypertension Assessment & Plan: Lori Luna is on Benicar HCT 40-12.5 mg once daily for this condition. Condition managed by cardiology. BP is slightly below goal today, 101/65. Lori Luna is asymptomatic. Pt attributes this to taking medication right before OV today. Pt is tolerating medication well, no adverse SE. Advised pt to be aware of hypotensive symptoms, such as presyncope. Ambulatory blood pressure monitoring encouraged; recommended they check both blood pressure and pulse if pt ever feel poorly in any way. No concerns today in this regard.  Continue with current antihypertensives.    Vitamin D deficiency Assessment & Plan: Lab Results  Component Value Date   VD25OH 33.0 03/24/2023  Pt is on Cholecalciferol 5000 units daily. Reviewed last obtained labs with pt which show low levels at 33.0. Pt is tolerating supplement well. Will recheck levels today to monitor changes.   Follow up:   Return in about 5 weeks (around 08/04/2023). She was informed of the importance of frequent follow up visits to maximize  her success with intensive lifestyle modifications for her multiple health conditions.  Subjective:   Chief complaint: Obesity Lori Luna is here to  discuss her progress with her obesity treatment plan. She is journaling 1450-1550 calories and 110+ grams protein daily with Category 2 meal plan as a guide with 6 ounces of lean protein at lunch and 8 ounces at dinner and states she is following her eating plan approximately 85-90% of the time. She states she is not exercising.  Interval History:  Lori Luna is here for a follow up office visit. Since last OV on 06/07/2023, Lori Luna has lost 4 lbs. She has not been journaling because "time does not allow". However, pt reports she has been improving with eating on plan. Additionally, pt reports a heavy feeling in her stomach that she has concerns about and wants to get an abdominal scan in the near future.    Pharmacotherapy for weight loss: She is currently taking  Tirzepatide 5 mg once weekly .   Review of Systems:  Pertinent positives were addressed with patient today.  Reviewed by clinician on day of visit: allergies, medications, problem list, medical history, surgical history, family history, social history, and previous encounter notes.  Weight Summary and Biometrics   Weight Lost Since Last Visit: 4 lb  Weight Gained Since Last Visit: 0   Vitals Temp: 98 F (36.7 C) BP: 101/65 Pulse Rate: 78 SpO2: 96 %   Anthropometric Measurements Height: 5\' 5"  (1.651 m) Weight: 219 lb (99.3 kg) BMI (Calculated): 36.44 Weight at Last Visit: 223 lb Weight Lost Since Last Visit: 4 lb Weight Gained Since Last Visit: 0 Starting Weight: 235 lb Total Weight Loss (lbs): 16 lb (7.258 kg) Peak Weight: 238 lb   Body Composition  Body Fat %: 45.3 % Fat Mass (lbs): 99.4 lbs Muscle Mass (lbs): 114.2 lbs Total Body Water (lbs): 79.4 lbs Visceral Fat Rating : 13   Other Clinical Data Fasting: yes Labs: no Today's Visit #: 5 Starting Date: 03/24/23    Objective:   PHYSICAL EXAM: Blood pressure 101/65, pulse 78, temperature 98 F (36.7 C), height 5\' 5"  (1.651 m), weight  219 lb (99.3 kg), last menstrual period 10/19/2018, SpO2 96%. Body mass index is 36.44 kg/m.  General: she is overweight, cooperative and in no acute distress. PSYCH: Has normal mood, affect and thought process.   HEENT: EOMI, sclerae are anicteric. Lungs: Normal breathing effort, no conversational dyspnea. Extremities: Moves * 4 Neurologic: A and O * 3, good insight  DIAGNOSTIC DATA REVIEWED: BMET    Component Value Date/Time   NA 142 01/29/2023 1022   K 4.2 01/29/2023 1022   CL 105 01/29/2023 1022   CO2 21 01/29/2023 1022   GLUCOSE 93 01/29/2023 1022   GLUCOSE 95 07/07/2021 1320   BUN 19 01/29/2023 1022   CREATININE 0.87 01/29/2023 1022   CALCIUM 9.5 01/29/2023 1022   GFRNONAA >60 07/07/2021 1320   GFRAA 89 06/01/2019 1323   Lab Results  Component Value Date   HGBA1C 5.6 01/29/2023   HGBA1C 5.5 05/04/2019   Lab Results  Component Value Date   INSULIN 18.0 03/24/2023   INSULIN 15.0 06/01/2019   Lab Results  Component Value Date   TSH 2.510 03/24/2023   CBC    Component Value Date/Time   WBC 4.2 03/24/2023 1024   WBC 5.0 07/07/2021 1320   RBC 4.93 03/24/2023 1024   RBC 4.72 07/07/2021 1320   HGB 14.3 03/24/2023 1024   HCT 43.1 03/24/2023 1024  PLT 313 03/24/2023 1024   MCV 87 03/24/2023 1024   MCH 29.0 03/24/2023 1024   MCH 29.2 07/07/2021 1320   MCHC 33.2 03/24/2023 1024   MCHC 33.9 07/07/2021 1320   RDW 12.9 03/24/2023 1024   Iron Studies No results found for: "IRON", "TIBC", "FERRITIN", "IRONPCTSAT" Lipid Panel     Component Value Date/Time   CHOL 209 (H) 01/29/2023 1022   TRIG 136 01/29/2023 1022   HDL 58 01/29/2023 1022   CHOLHDL 3.6 01/29/2023 1022   LDLCALC 127 (H) 01/29/2023 1022   Hepatic Function Panel     Component Value Date/Time   PROT 6.9 01/29/2023 1022   ALBUMIN 4.4 01/29/2023 1022   AST 21 01/29/2023 1022   ALT 32 01/29/2023 1022   ALKPHOS 84 01/29/2023 1022   BILITOT 0.3 01/29/2023 1022   BILIDIR 0.1 11/16/2009 1102    IBILI 0.8 11/16/2009 1102      Component Value Date/Time   TSH 2.510 03/24/2023 1024   Nutritional Lab Results  Component Value Date   VD25OH 33.0 03/24/2023   VD25OH 29.6 (L) 05/04/2019    Attestations:   I, Lori Luna, acting as a Stage manager for [employer], DO., have compiled all relevant documentation for today's office visit on behalf of Lori Lot, DO, while in the presence of [employer], DO.  I have reviewed the above documentation for accuracy and completeness, and I agree with the above. Lori Luna, D.O.  The 21st Century Cures Act was signed into law in 2016 which includes the topic of electronic health records.  This provides immediate access to information in MyChart.  This includes consultation notes, operative notes, office notes, lab results and pathology reports.  If you have any questions about what you read please let us know at your next visit so we can discuss your concerns and take corrective action if need be.  We are right here with you.

## 2023-07-12 ENCOUNTER — Encounter: Payer: Self-pay | Admitting: Pharmacist Clinician (PhC)/ Clinical Pharmacy Specialist

## 2023-07-12 MED ORDER — TIRZEPATIDE-WEIGHT MANAGEMENT 7.5 MG/0.5ML ~~LOC~~ SOLN: 7.5000 mg | SUBCUTANEOUS | 1 refills | Status: DC

## 2023-08-04 ENCOUNTER — Encounter (INDEPENDENT_AMBULATORY_CARE_PROVIDER_SITE_OTHER): Payer: Self-pay | Admitting: Family Medicine

## 2023-08-04 ENCOUNTER — Ambulatory Visit (INDEPENDENT_AMBULATORY_CARE_PROVIDER_SITE_OTHER): Admitting: Family Medicine

## 2023-08-04 VITALS — BP 129/82 | HR 71 | Temp 97.8°F | Ht 65.0 in | Wt 216.0 lb

## 2023-08-04 DIAGNOSIS — E7800 Pure hypercholesterolemia, unspecified: Secondary | ICD-10-CM | POA: Diagnosis not present

## 2023-08-04 DIAGNOSIS — I10 Essential (primary) hypertension: Secondary | ICD-10-CM | POA: Diagnosis not present

## 2023-08-04 DIAGNOSIS — E88819 Insulin resistance, unspecified: Secondary | ICD-10-CM

## 2023-08-04 DIAGNOSIS — G4733 Obstructive sleep apnea (adult) (pediatric): Secondary | ICD-10-CM | POA: Diagnosis not present

## 2023-08-04 DIAGNOSIS — E559 Vitamin D deficiency, unspecified: Secondary | ICD-10-CM

## 2023-08-04 DIAGNOSIS — Z6835 Body mass index (BMI) 35.0-35.9, adult: Secondary | ICD-10-CM

## 2023-08-04 MED ORDER — D3 5000 125 MCG (5000 UT) PO CAPS: 5000.0000 [IU] | ORAL_CAPSULE | Freq: Every day | ORAL | Status: DC

## 2023-08-04 NOTE — Progress Notes (Signed)
 [name], D.O.  ABFM, ABOM Specializing in Clinical Bariatric Medicine  Office located at: [address] [place], [place]  [address]   Assessment and Plan:   Orders Placed This Encounter  Procedures   Hemoglobin A1c   Insulin , random   Vitamin B12   VITAMIN D  25 Hydroxy (Vit-D Deficiency, Fractures)   Lipid panel   Comprehensive metabolic panel with GFR   Medications Discontinued During This Encounter  Medication Reason   Cholecalciferol (D3 5000) 125 MCG (5000 UT) capsule Reorder    Meds ordered this encounter  Medications   Cholecalciferol (D3 5000) 125 MCG (5000 UT) capsule    Sig: Take 1 capsule (5,000 Units total) by mouth daily.    Will review labs obtained today at next OV (A1c, fasting insulin , B12, Vit D, CMP, and lipid panel). FOR THE DISEASE OF OBESITY:  BMI 35.0-35.9,adult - Current 35.94 Morbid obesity (HCC) -starting BMI 03/24/23-38.5 Assessment & Plan: Since last office visit on 06/30/2023, patient's muscle mass has decreased by 3.2 lbs. Fat mass has increased by 0.4 lbs. Total body water has decreased by 0.4 lbs.  Counseling done on how various foods will affect these numbers and how to maximize success  Total lbs lost to date: 19 lbs Total weight loss percentage to date: 8.09%    Recommended Dietary Goals Lori Luna is currently in the action stage of change. As such, her goal is to continue weight management plan.  She has agreed to: continue current plan   Behavioral Intervention We discussed the following today: Make healthy choices when eating out, continue to practice mindfulness when eating and staying on track while traveling and vacationing  Additional resources provided today: Handout on CAT 2 meal plan  Evidence-based interventions for health behavior change were utilized today including the discussion of self monitoring techniques, problem-solving barriers and SMART goal setting techniques.   Regarding patient's less desirable  eating habits and patterns, we employed the technique of small changes.   Pt will specifically work on: Weigh foods to ensure accuracy with adherence to MP for next visit.    Recommended Physical Activity Goals Lori Luna has been advised to work up to 300-450 minutes of moderate intensity aerobic activity a week and strengthening exercises 2-3 times per week for cardiovascular health, weight loss maintenance and preservation of muscle mass.   She has agreed to :  Increase physical activity in their day and reduce sedentary time (increase NEAT).   Pharmacotherapy We both agreed to : continue with nutritional and behavioral strategies   FOR ASSOCIATED CONDITIONS ADDRESSED TODAY:  Insulin  resistance Assessment & Plan: Lori Luna is not currently taking any IR medications. Diet/approach, hunger/cravings well controlled. Pt feels she is eating mostly on plan but not noticing the weight loss she expects. However, pt is not accurately measuring foods. She endorses measuring 6 ounces of meat as the size of her inner palm. Encouraged pt to use a scale to accurately measure foods to promote weight loss. Continue following high protein, low carb MP. Will continue to monitor.   Relevant Orders: -     Hemoglobin A1c -     Insulin , random   Vitamin D  deficiency Assessment & Plan: Lab Results  Component Value Date   VD25OH 33.0 03/24/2023  Lori Luna is taking Cholecalciferol 5000 units daily. Last obtained labs were sub-optimal. Pt is compliant with supplement, tolerating well. Continue supplement at current dose. Will recheck today.   Relevant Orders: -     Vitamin B12 -  VITAMIN D  25 Hydroxy (Vit-D Deficiency, Fractures) -     D3 5000; Take 1 capsule (5,000 Units total) by mouth daily.   Essential hypertension Assessment & Plan: BP Readings from Last 3 Encounters:  08/04/23 129/82  06/30/23 101/65  06/09/23 119/76  Lori Luna takes Benicar  HCT 40-12.5 mg once daily for HTN. BP is stable  today at 129/82, and has improved from LOV. Continue current medication regimen. Continue following heart-healthy RCNP.  Relevant Orders: -     Comprehensive metabolic panel with GFR   Pure hypercholesterolemia Assessment & Plan: Lori Luna is not on any medication for this condition, she prefers to try weight loss as a method to improve levels. Condition managed by Dr. Theodis Fiscal of cardiology. Continue eating foods low in saturated/trans fats. Encouraged pt to engage in daily exercise. Will recheck lipid levels today.   Relevant Orders: -     Lipid panel   OSA (obstructive sleep apnea) on CPAP Assessment & Plan: Pt endorses using CPAP nightly for her OSA, but takes the machine off at some during sleep, usually about 4 hours in. Pt sleeps about 6 hours nightly, and endorses having generally unrestful sleep. Additionally, pt expresses concerns about menopause playing a factor in sleep quality. Reminded [name] that low oxygen levels at night can make it harder to lose weight. Informed her that menopause can cause an interruption in sleep quality. Educated on risks of poorly controlled OSA, including increased risk of MI, stroke, HTN, and more. Reminded of goal of 7-9 hours of sleep nightly. Recommended to f/up with sleep specialist about potentially changing CPAP to fit more comfortably to improve sleep quality.   Follow up:   Return in about 29 days (around 09/02/2023). She was informed of the importance of frequent follow up visits to maximize her success with intensive lifestyle modifications for her multiple health conditions.  Lori Luna is aware that we will review all of her lab results at our next visit together in person.  She is aware that if anything is critical/ life threatening with the results, we will be contacting her via MyChart or by my CMA will be calling them prior to the office visit to discuss acute management.    Subjective:   Chief complaint: Obesity Lori Luna  is here to discuss her progress with her obesity treatment plan. She is journaling 1450-1550 calories and 110+ grams protein daily with Category 2 meal plan as a guide with 6 ounces of lean protein at lunch and 8 ounces at dinner and states she is following her eating plan approximately 90% of the time. She states she is not exercising.  Interval History:  Lori Luna is here for a follow up office visit. Since last OV on 06/30/2023, pt is down 3 lbs. 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 Pt is not measuring, weighing, or journaling foods.   Pharmacotherapy for weight loss: She is currently taking Tirzepatide 7.5 mg once weekly.   Review of Systems:  Pertinent positives were addressed with patient today.  Reviewed by clinician on day of visit: allergies, medications, problem list, medical history, surgical history, family history, social history, and previous encounter notes.  Weight Summary and Biometrics   Weight Lost Since Last Visit: 3  Weight Gained Since Last Visit: 0    Vitals Temp: 97.8 F (36.6 C) BP: 129/82 Pulse Rate: 71 SpO2: 100 %   Anthropometric Measurements Height: 5\' 5"  (1.651 m) Weight: 216 lb (98 kg) BMI (Calculated): 35.94  Weight at Last Visit: 219 lb Weight Lost Since Last Visit: 3 Weight Gained Since Last Visit: 0 Starting Weight: 235 lb Total Weight Loss (lbs): 19 lb (8.618 kg) Peak Weight: 238 lb   Body Composition  Body Fat %: 46 % Fat Mass (lbs): 99.8 lbs Muscle Mass (lbs): 111 lbs Total Body Water (lbs): 79 lbs Visceral Fat Rating : 13   Other Clinical Data Fasting: yes Labs: yes Today's Visit #: 6 Starting Date: 03/24/23    Objective:   PHYSICAL EXAM: Blood pressure 129/82, pulse 71, temperature 97.8 F (36.6 C), height 5\' 5"  (1.651 m), weight 216 lb (98 kg), last menstrual period 10/19/2018, SpO2 100%. Body mass index is 35.94 kg/m.  General: she is overweight, cooperative and  in no acute distress. PSYCH: Has normal mood, affect and thought process.   HEENT: EOMI, sclerae are anicteric. Lungs: Normal breathing effort, no conversational dyspnea. Extremities: Moves * 4 Neurologic: A and O * 3, good insight  DIAGNOSTIC DATA REVIEWED: BMET    Component Value Date/Time   NA 142 01/29/2023 1022   K 4.2 01/29/2023 1022   CL 105 01/29/2023 1022   CO2 21 01/29/2023 1022   GLUCOSE 93 01/29/2023 1022   GLUCOSE 95 07/07/2021 1320   BUN 19 01/29/2023 1022   CREATININE 0.87 01/29/2023 1022   CALCIUM 9.5 01/29/2023 1022   GFRNONAA >60 07/07/2021 1320   GFRAA 89 06/01/2019 1323   Lab Results  Component Value Date   HGBA1C 5.6 01/29/2023   HGBA1C 5.5 05/04/2019   Lab Results  Component Value Date   INSULIN  18.0 03/24/2023   INSULIN  15.0 06/01/2019   Lab Results  Component Value Date   TSH 2.510 03/24/2023   CBC    Component Value Date/Time   WBC 4.2 03/24/2023 1024   WBC 5.0 07/07/2021 1320   RBC 4.93 03/24/2023 1024   RBC 4.72 07/07/2021 1320   HGB 14.3 03/24/2023 1024   HCT 43.1 03/24/2023 1024   PLT 313 03/24/2023 1024   MCV 87 03/24/2023 1024   MCH 29.0 03/24/2023 1024   MCH 29.2 07/07/2021 1320   MCHC 33.2 03/24/2023 1024   MCHC 33.9 07/07/2021 1320   RDW 12.9 03/24/2023 1024   Iron Studies No results found for: "IRON", "TIBC", "FERRITIN", "IRONPCTSAT" Lipid Panel     Component Value Date/Time   CHOL 209 (H) 01/29/2023 1022   TRIG 136 01/29/2023 1022   HDL 58 01/29/2023 1022   CHOLHDL 3.6 01/29/2023 1022   LDLCALC 127 (H) 01/29/2023 1022   Hepatic Function Panel     Component Value Date/Time   PROT 6.9 01/29/2023 1022   ALBUMIN 4.4 01/29/2023 1022   AST 21 01/29/2023 1022   ALT 32 01/29/2023 1022   ALKPHOS 84 01/29/2023 1022   BILITOT 0.3 01/29/2023 1022   BILIDIR 0.1 11/16/2009 1102   IBILI 0.8 11/16/2009 1102      Component Value Date/Time   TSH 2.510 03/24/2023 1024   Nutritional Lab Results  Component Value Date    VD25OH 33.0 03/24/2023   VD25OH 29.6 (L) 05/04/2019    Attestations:   I, Camryn Mix, acting as a Stage manager for [employer], DO., have compiled all relevant documentation for today's office visit on behalf of Lori Sensor, DO, while in the presence of [employer], DO.  Reviewed by clinician on day of visit: allergies, medications, problem list, medical history, surgical history, family history, social history, and previous encounter notes pertinent to patient's obesity diagnosis. I have  spent 43 minutes in the care of the patient today including: Specifically: 39 minutes spent in direct face to face counseling of pt's MP, importance of understanding portions of foods, healthy eating out and travel strategies, GLP-1 medication counseling, and on her diseases and how foods and supplements will affect disease processes in addition to counseling described below. 4 minutes spent on pre- and post-care.  Preparing to see patient (e.g. review and interpretation of tests, old notes ), obtaining and/or reviewing separately obtained history, performing a medically appropriate examination or evaluation, counseling and educating the patient, ordering medications, test or procedures, documenting clinical information in the electronic or other health care record, and independently interpreting results and communicating results to the patient, family, or caregiver   I have reviewed the above documentation for accuracy and completeness, and I agree with the above. [name], D.O.  The 21st Century Cures Act was signed into law in 2016 which includes the topic of electronic health records.  This provides immediate access to information in MyChart.  This includes consultation notes, operative notes, office notes, lab results and pathology reports.  If you have any questions about what you read please let us  know at your next visit so we can discuss your concerns and take corrective action if need  be.  We are right here with you.

## 2023-08-05 LAB — COMPREHENSIVE METABOLIC PANEL WITH GFR
ALT: 36 IU/L — ABNORMAL HIGH (ref 0–32)
AST: 22 IU/L (ref 0–40)
Albumin: 4.5 g/dL (ref 3.8–4.9)
Alkaline Phosphatase: 86 IU/L (ref 44–121)
BUN/Creatinine Ratio: 18 (ref 9–23)
BUN: 15 mg/dL (ref 6–24)
Bilirubin Total: 0.4 mg/dL (ref 0.0–1.2)
CO2: 23 mmol/L (ref 20–29)
Calcium: 9.6 mg/dL (ref 8.7–10.2)
Chloride: 104 mmol/L (ref 96–106)
Creatinine, Ser: 0.82 mg/dL (ref 0.57–1.00)
Globulin, Total: 2.3 g/dL (ref 1.5–4.5)
Glucose: 82 mg/dL (ref 70–99)
Potassium: 4.4 mmol/L (ref 3.5–5.2)
Sodium: 140 mmol/L (ref 134–144)
Total Protein: 6.8 g/dL (ref 6.0–8.5)
eGFR: 83 mL/min/{1.73_m2} (ref >59)

## 2023-08-05 LAB — VITAMIN D 25 HYDROXY (VIT D DEFICIENCY, FRACTURES): Vit D, 25-Hydroxy: 75.3 ng/mL (ref 30.0–100.0)

## 2023-08-05 LAB — HEMOGLOBIN A1C
Est. average glucose Bld gHb Est-mCnc: 108 mg/dL
Hgb A1c MFr Bld: 5.4 % (ref 4.8–5.6)

## 2023-08-05 LAB — LIPID PANEL
Chol/HDL Ratio: 4 ratio (ref 0.0–4.4)
Cholesterol, Total: 204 mg/dL — ABNORMAL HIGH (ref 100–199)
HDL: 51 mg/dL (ref >39)
LDL Chol Calc (NIH): 125 mg/dL — ABNORMAL HIGH (ref 0–99)
Triglycerides: 161 mg/dL — ABNORMAL HIGH (ref 0–149)
VLDL Cholesterol Cal: 28 mg/dL (ref 5–40)

## 2023-08-05 LAB — VITAMIN B12: Vitamin B-12: 415 pg/mL (ref 232–1245)

## 2023-08-05 LAB — INSULIN, RANDOM: INSULIN: 14.4 u[IU]/mL (ref 2.6–24.9)

## 2023-08-25 ENCOUNTER — Other Ambulatory Visit: Payer: Self-pay | Admitting: Cardiovascular Disease

## 2023-08-30 ENCOUNTER — Ambulatory Visit (HOSPITAL_BASED_OUTPATIENT_CLINIC_OR_DEPARTMENT_OTHER): Payer: BC Managed Care – PPO | Admitting: Obstetrics & Gynecology

## 2023-08-30 ENCOUNTER — Encounter (HOSPITAL_BASED_OUTPATIENT_CLINIC_OR_DEPARTMENT_OTHER): Payer: Self-pay | Admitting: Obstetrics & Gynecology

## 2023-08-30 VITALS — BP 125/88 | HR 79 | Ht 66.0 in | Wt 218.8 lb

## 2023-08-30 DIAGNOSIS — Z79890 Hormone replacement therapy (postmenopausal): Secondary | ICD-10-CM

## 2023-08-30 DIAGNOSIS — N951 Menopausal and female climacteric states: Secondary | ICD-10-CM | POA: Diagnosis not present

## 2023-08-30 MED ORDER — ESTRADIOL 0.025 MG/24HR TD PTTW: 1.0000 | MEDICATED_PATCH | TRANSDERMAL | 2 refills | Status: DC

## 2023-08-30 MED ORDER — PROGESTERONE MICRONIZED 100 MG PO CAPS
100.0000 mg | ORAL_CAPSULE | Freq: Every day | ORAL | 2 refills | Status: DC
Start: 2023-08-30 — End: 2023-11-29

## 2023-08-30 NOTE — Progress Notes (Unsigned)
 GYNECOLOGY  VISIT  CC:   follow up, dyspareunia  HPI: 58 y.o. G33P3003 Married White or Caucasian female here for atrophic vaginal changes and dyspareunia.  She was prescribed vaginal estradiol  cream.  It has helped with the dyspareunia even though she had not been able to use it regularly.  The twice weekly dosing is difficult at times.    Is on Zepbound and has lost 26 pounds.  Her triglycerides have increase about 30 pounds.  Once liver enzyme was elevated just a little bit at 36.    Has questions about other HRT options.  We discussed risks including CVD, stroke, clotting issues and different types of    .   Past Medical History:  Diagnosis Date  . Acoustic neuroma (HCC) 2023   followed by Dr. Elidia Grout, [facility]  . Anxiety   . Atypical chest pain 06/19/2016  . Back pain   . Brain fog   . DDD (degenerative disc disease), lumbar   . Essential hypertension 06/19/2016  . Family history of degenerative disc disease   . Gallbladder problem   . Hyperlipidemia 06/19/2016  . Intussusception intestine (HCC)    LUQ, saw Dr. Melton Squires, [facility]  . Joint pain   . OSA (obstructive sleep apnea) 08/13/2020  . PONV (postoperative nausea and vomiting)   . Trouble in sleeping   . Vitamin D  deficiency   . Weight gain     MEDS:   Current Outpatient Medications on File Prior to Visit  Medication Sig Dispense Refill  . Cholecalciferol (D3 5000) 125 MCG (5000 UT) capsule Take 1 capsule (5,000 Units total) by mouth daily.    . estradiol  (ESTRACE ) 0.1 MG/GM vaginal cream 1 gram vaginally twice weekly 42.5 g 1  . ibuprofen  (ADVIL ) 800 MG tablet Take 1 tablet (800 mg total) by mouth every 8 (eight) hours as needed. 30 tablet 0  . loratadine (CLARITIN) 10 MG tablet Take 10 mg by mouth daily as needed for allergies.    . Magnesium Gluconate (MAGNESIUM 27 PO) Take by mouth.    . methocarbamol (ROBAXIN) 500 MG tablet Take 500 mg by mouth every 6 (six) hours as needed for muscle spasms.    .  olmesartan -hydrochlorothiazide  (BENICAR  HCT) 40-12.5 MG tablet TAKE 1 TABLET BY MOUTH EVERY DAY 90 tablet 3  . POTASSIUM PO Take by mouth.    . tirzepatide 7.5 MG/0.5ML injection vial Inject 7.5 mg into the skin every 7 (seven) days. 2 mL 1  . tretinoin (RETIN-A) 0.025 % cream Apply 1 application  topically daily.     No current facility-administered medications on file prior to visit.    ALLERGIES: Amoxicillin-pot clavulanate  SH:  ***  ROS  PHYSICAL EXAMINATION:    BP 125/88 (Cuff Size: Normal)   Pulse 79   Ht 5\' 6"  (1.676 m)   Wt 218 lb 12.8 oz (99.2 kg)   LMP 10/19/2018 (Exact Date)   BMI 35.32 kg/m     General appearance: alert, cooperative and appears stated age Neck: no adenopathy, supple, symmetrical, trachea midline and thyroid  {CHL AMB PHY EX THYROID  NORM DEFAULT:[id number]::"normal to inspection and palpation"} CV:  {Exam; heart brief:31539} Lungs:  {pe lungs ob:314451} Breasts: {Exam; breast:13139::"normal appearance, no masses or tenderness"} Abdomen: soft, non-tender; bowel sounds normal; no masses,  no organomegaly Lymph:  no inguinal LAD noted  Pelvic: External genitalia:  no lesions              Urethra:  normal appearing urethra with no masses, tenderness or  lesions              Bartholins and Skenes: normal                 Vagina: {exam; pelvic vaginal:30846}              Cervix: {CHL AMB PHY EX CERVIX NORM DEFAULT:[phone]::"no lesions"}              Bimanual Exam:  Uterus:  {CHL AMB PHY EX UTERUS NORM DEFAULT:[phone]::"normal size, contour, position, consistency, mobility, non-tender"}              Adnexa: {CHL AMB PHY EX ADNEXA NO MASS DEFAULT:[phone]::"no mass, fullness, tenderness"}              Rectovaginal: {yes no:314532}.  Confirms.              Anus:  normal sphincter tone, no lesions  Chaperone, ***, CMA, was present for exam.  Assessment/Plan: There are no diagnoses linked to this encounter.

## 2023-08-31 NOTE — Telephone Encounter (Signed)
 Mychart message sent about dose

## 2023-09-02 ENCOUNTER — Encounter (INDEPENDENT_AMBULATORY_CARE_PROVIDER_SITE_OTHER): Payer: Self-pay | Admitting: Family Medicine

## 2023-09-02 ENCOUNTER — Encounter (HOSPITAL_BASED_OUTPATIENT_CLINIC_OR_DEPARTMENT_OTHER): Payer: Self-pay | Admitting: Obstetrics & Gynecology

## 2023-09-02 ENCOUNTER — Ambulatory Visit (INDEPENDENT_AMBULATORY_CARE_PROVIDER_SITE_OTHER): Admitting: Family Medicine

## 2023-09-02 VITALS — BP 111/76 | HR 79 | Temp 97.7°F | Ht 65.0 in | Wt 215.0 lb

## 2023-09-02 DIAGNOSIS — E7800 Pure hypercholesterolemia, unspecified: Secondary | ICD-10-CM

## 2023-09-02 DIAGNOSIS — E88819 Insulin resistance, unspecified: Secondary | ICD-10-CM

## 2023-09-02 DIAGNOSIS — E559 Vitamin D deficiency, unspecified: Secondary | ICD-10-CM | POA: Diagnosis not present

## 2023-09-02 DIAGNOSIS — Z6835 Body mass index (BMI) 35.0-35.9, adult: Secondary | ICD-10-CM

## 2023-09-02 DIAGNOSIS — E538 Deficiency of other specified B group vitamins: Secondary | ICD-10-CM

## 2023-09-02 DIAGNOSIS — R7401 Elevation of levels of liver transaminase levels: Secondary | ICD-10-CM

## 2023-09-02 MED ORDER — B-12 500 MCG PO TABS: ORAL_TABLET | ORAL | Status: AC

## 2023-09-02 MED ORDER — D3 5000 125 MCG (5000 UT) PO CAPS
ORAL_CAPSULE | ORAL | Status: AC
Start: 2023-09-02 — End: ?

## 2023-09-02 NOTE — Progress Notes (Signed)
 Lori Luna, D.O.  ABFM, ABOM Specializing in Clinical Bariatric Medicine  Office located at: [address] [place], [place]  [address]   Assessment and Plan:   Medications Discontinued During This Encounter  Medication Reason   Cholecalciferol (D3 5000) 125 MCG (5000 UT) capsule      Meds ordered this encounter  Medications   Cyanocobalamin  (B-12) 500 MCG TABS    Sig: 500 mcg daily   Cholecalciferol (D3 5000) 125 MCG (5000 UT) capsule    Sig: 1 po every other day    FOR THE DISEASE OF OBESITY:  BMI 35.0-35.9,adult - Current 35.78 Morbid obesity (HCC) -starting BMI 03/24/23-38.5 Assessment & Plan: Since last office visit on 08/04/2023 patient's  Muscle mass has increased by 1.6 lb. Fat mass has decreased by 2.8 lb. Total body water has decreased by 1.2 lb.  Counseling done on how various foods will affect these numbers and how to maximize success  Total lbs lost to date: 20 lbs  Total weight loss percentage to date: 8.51%    Recommended Dietary Goals Lori Luna is currently in the action stage of change. As such, her goal is to continue weight management plan.  She has agreed to: continue current plan   Behavioral Intervention We discussed the following today: continue to work on implementation of reduced calorie nutritional plan and staying on track while traveling and vacationing  Additional resources provided today: Handout on Daily Food Journaling Log  Evidence-based interventions for health behavior change were utilized today including the discussion of self monitoring techniques, problem-solving barriers and SMART goal setting techniques.   Regarding patient's less desirable eating habits and patterns, we employed the technique of small changes.   Pt will specifically work on being more consistent with journaling her intake/bringing in her log.    Recommended Physical Activity Goals Lori Luna has been advised to work up to 300-450 minutes of moderate  intensity aerobic activity a week and strengthening exercises 2-3 times per week for cardiovascular health, weight loss maintenance and preservation of muscle mass.   She has agreed to : Think about enjoyable ways to increase daily physical activity and overcoming barriers to exercise   Pharmacotherapy Reports good compliance and tolerance of Tirzepatide 7.5 mg wkly. Continue regimen; do not recommend increasing dose since pt has good control over her hunger and cravings.    ASSOCIATED CONDITIONS ADDRESSED TODAY:  Insulin  resistance Assessment & Plan: Most recent labs:  Lab Results  Component Value Date   HGBA1C 5.4 08/04/2023   HGBA1C 5.6 01/29/2023   HGBA1C 5.5 05/04/2019   INSULIN  14.4 08/04/2023   INSULIN  18.0 03/24/2023   INSULIN  15.0 06/01/2019   Lab Results  Component Value Date   CREATININE 0.82 08/04/2023   BUN 15 08/04/2023   NA 140 08/04/2023   K 4.4 08/04/2023   CL 104 08/04/2023   CO2 23 08/04/2023   Her HgbA1c is at goal. Her fasting insulin  improved from last check; eventual goal <5. Her hunger and cravings are controlled. Her kidney function and electrolytes are WNL. Continue with reduced calorie meal plan low on processed crabs and simple sugars. Ongoing weight loss will improve insulin  resistance.   Elevated ALT measurement Assessment & Plan: Most recent labs:     Component Value Date/Time   PROT 6.8 08/04/2023 1144   ALBUMIN 4.5 08/04/2023 1144   AST 22 08/04/2023 1144   ALT 36 (H) 08/04/2023 1144   ALKPHOS 86 08/04/2023 1144   BILITOT 0.4 08/04/2023 1144  BILIDIR 0.1 11/16/2009 1102   IBILI 0.8 11/16/2009 1102    ALT is not at goal. This is likely fatty liver given her elevated visceral fat rating and elevated body fat percentage. Encouraged pt to obtain further workup w/ PCP. Continue with reducing saturated fats, simple and added sugars. Losing 10% of body weight may also improve condition.    B12 deficiency Assessment & Plan: Most recent  labs:  Lab Results  Component Value Date   VITAMINB12 415 08/04/2023   Is taking a daily Centrum Silver Women's multi-vitamin which contains some B12. B12 level is 415, not at goal of over 500.  This diagnosis was reviewed with the patient and education was provided. Pt instructed to continue multivitamin and START separate OTC B12 500 mcg daily. Recheck levels in 3 mos.    Vitamin D  deficiency Assessment & Plan: Most recent VD: Lab Results  Component Value Date   VD25OH 75.3 08/04/2023   VD25OH 33.0 03/24/2023   VD25OH 29.6 (L) 05/04/2019   Doing well on OTC VD 5,000 units daily. Her VD levels are above goal of 50 to 70. Currently no signs or symptoms of hypervitaminosis D. Pt agrees to DECREASE to VD 5,000 units every other day. Recheck in 3-4 mos.    Pure hypercholesterolemia Assessment & Plan: Most recent labs: Lab Results  Component Value Date   CHOL 204 (H) 08/04/2023   HDL 51 08/04/2023   LDLCALC 125 (H) 08/04/2023   TRIG 161 (H) 08/04/2023   CHOLHDL 4.0 08/04/2023   The 10-year ASCVD risk score (Arnett DK, et al., 2019) is: 2.9%   Values used to calculate the score:     Age: 58 years     Sex: Female     Is Non-Hispanic African American: No     Diabetic: No     Tobacco smoker: No     Systolic Blood Pressure: 111 mmHg     Is BP treated: Yes     HDL Cholesterol: 51 mg/dL     Total Cholesterol: 204 mg/dL  Diet/lifestyle approach. Her TRIG have worsened. Her LDL has slightly improved.   Continue to work on nutrition plan -decreasing simple and fatty carbohydrates, increasing lean proteins, decreasing saturated fats and cholesterol , avoiding trans fats and exercise as able to promote weight loss, improve lipids and decrease cardiovascular risks. Recommend pt explore the [facility] website for further information about hyperlipidemia and increased triglycerides.    Follow up:   Return 09/30/2023 at 9:00 AM. She was informed of the importance of frequent follow up visits to  maximize her success with intensive lifestyle modifications for her multiple health conditions.  Subjective:   Chief complaint: Obesity Lori Luna is here to discuss her progress with her obesity treatment plan. She is journaling 1450-1550 calories and 110+ grams protein daily with Category 2 meal plan as a guide with 6 ounces of lean protein at lunch and 8 ounces at dinner and states she is following her eating plan approximately 90% of the time. She states she is not exercising.  Interval History:  Lori Luna is here for a follow up office visit. Since last OV on 08/04/2023, pt is down 1 #. Went to a graduation and beach trip and had seafood and alcohol. Apart from this she is doing well with her meal plan and is weighing her proteins and veggies. Her hunger and cravings are controlled.   Pharmacotherapy that aid weight loss: She is currently taking Tirzepatide 7.5 mg wkly .  Review of Systems:  Pertinent positives were addressed with patient today.  Reviewed by clinician on day of visit: allergies, medications, problem list, medical history, surgical history, family history, social history, and previous encounter notes.  Weight Summary and Biometrics   Weight Lost Since Last Visit: 1lb  Weight Gained Since Last Visit: 0  Vitals Temp: 97.7 F (36.5 C) BP: 111/76 Pulse Rate: 79 SpO2: 98 %   Anthropometric Measurements Height: 5\' 5"  (1.651 m) Weight: 215 lb (97.5 kg) BMI (Calculated): 35.78 Weight at Last Visit: 216lb Weight Lost Since Last Visit: 1lb Weight Gained Since Last Visit: 0 Starting Weight: 235lb Total Weight Loss (lbs): 20 lb (9.072 kg) Peak Weight: 238lb   Body Composition  Body Fat %: 45 % Fat Mass (lbs): 97 lbs Muscle Mass (lbs): 112.6 lbs Total Body Water (lbs): 77.8 lbs Visceral Fat Rating : 13   Other Clinical Data Fasting: no Labs: no Today's Visit #: 7 Starting Date: 03/24/23   Objective:   PHYSICAL EXAM: Blood pressure 111/76,  pulse 79, temperature 97.7 F (36.5 C), height 5\' 5"  (1.651 m), weight 215 lb (97.5 kg), last menstrual period 10/19/2018, SpO2 98%. Body mass index is 35.78 kg/m.  General: she is overweight, cooperative and in no acute distress. PSYCH: Has normal mood, affect and thought process.   HEENT: EOMI, sclerae are anicteric. Lungs: Normal breathing effort, no conversational dyspnea. Extremities: Moves * 4 Neurologic: A and O * 3, good insight  DIAGNOSTIC DATA REVIEWED: BMET    Component Value Date/Time   NA 140 08/04/2023 1144   K 4.4 08/04/2023 1144   CL 104 08/04/2023 1144   CO2 23 08/04/2023 1144   GLUCOSE 82 08/04/2023 1144   GLUCOSE 95 07/07/2021 1320   BUN 15 08/04/2023 1144   CREATININE 0.82 08/04/2023 1144   CALCIUM 9.6 08/04/2023 1144   GFRNONAA >60 07/07/2021 1320   GFRAA 89 06/01/2019 1323   Lab Results  Component Value Date   HGBA1C 5.4 08/04/2023   HGBA1C 5.5 05/04/2019   Lab Results  Component Value Date   INSULIN  14.4 08/04/2023   INSULIN  15.0 06/01/2019   Lab Results  Component Value Date   TSH 2.510 03/24/2023   CBC    Component Value Date/Time   WBC 4.2 03/24/2023 1024   WBC 5.0 07/07/2021 1320   RBC 4.93 03/24/2023 1024   RBC 4.72 07/07/2021 1320   HGB 14.3 03/24/2023 1024   HCT 43.1 03/24/2023 1024   PLT 313 03/24/2023 1024   MCV 87 03/24/2023 1024   MCH 29.0 03/24/2023 1024   MCH 29.2 07/07/2021 1320   MCHC 33.2 03/24/2023 1024   MCHC 33.9 07/07/2021 1320   RDW 12.9 03/24/2023 1024   Iron Studies No results found for: "IRON", "TIBC", "FERRITIN", "IRONPCTSAT" Lipid Panel     Component Value Date/Time   CHOL 204 (H) 08/04/2023 1139   TRIG 161 (H) 08/04/2023 1139   HDL 51 08/04/2023 1139   CHOLHDL 4.0 08/04/2023 1139   LDLCALC 125 (H) 08/04/2023 1139   Hepatic Function Panel     Component Value Date/Time   PROT 6.8 08/04/2023 1144   ALBUMIN 4.5 08/04/2023 1144   AST 22 08/04/2023 1144   ALT 36 (H) 08/04/2023 1144   ALKPHOS 86  08/04/2023 1144   BILITOT 0.4 08/04/2023 1144   BILIDIR 0.1 11/16/2009 1102   IBILI 0.8 11/16/2009 1102      Component Value Date/Time   TSH 2.510 03/24/2023 1024   Nutritional Lab Results  Component  Value Date   VD25OH 75.3 08/04/2023   VD25OH 33.0 03/24/2023   VD25OH 29.6 (L) 05/04/2019    Attestations:   I, Special Puri , acting as a Stage manager for Lori Sensor, DO., have compiled all relevant documentation for today's office visit on behalf of Lori Sensor, DO, while in the presence of [employer], DO.  I have reviewed the above documentation for accuracy and completeness, and I agree with the above. Lori Luna, D.O.  The 21st Century Cures Act was signed into law in 2016 which includes the topic of electronic health records.  This provides immediate access to information in MyChart.  This includes consultation notes, operative notes, office notes, lab results and pathology reports.  If you have any questions about what you read please let us  know at your next visit so we can discuss your concerns and take corrective action if need be.  We are right here with you.

## 2023-09-10 ENCOUNTER — Encounter (HOSPITAL_BASED_OUTPATIENT_CLINIC_OR_DEPARTMENT_OTHER): Payer: Self-pay | Admitting: Obstetrics & Gynecology

## 2023-09-22 ENCOUNTER — Ambulatory Visit (HOSPITAL_BASED_OUTPATIENT_CLINIC_OR_DEPARTMENT_OTHER): Admitting: Cardiovascular Disease

## 2023-09-22 ENCOUNTER — Encounter (HOSPITAL_BASED_OUTPATIENT_CLINIC_OR_DEPARTMENT_OTHER): Payer: Self-pay | Admitting: Cardiovascular Disease

## 2023-09-22 VITALS — BP 102/72 | HR 60 | Ht 65.0 in | Wt 214.0 lb

## 2023-09-22 DIAGNOSIS — I10 Essential (primary) hypertension: Secondary | ICD-10-CM

## 2023-09-22 DIAGNOSIS — E66812 Obesity, class 2: Secondary | ICD-10-CM | POA: Diagnosis not present

## 2023-09-22 DIAGNOSIS — E7800 Pure hypercholesterolemia, unspecified: Secondary | ICD-10-CM | POA: Diagnosis not present

## 2023-09-22 DIAGNOSIS — Z5181 Encounter for therapeutic drug level monitoring: Secondary | ICD-10-CM

## 2023-09-22 DIAGNOSIS — G4733 Obstructive sleep apnea (adult) (pediatric): Secondary | ICD-10-CM | POA: Diagnosis not present

## 2023-09-22 DIAGNOSIS — Z6837 Body mass index (BMI) 37.0-37.9, adult: Secondary | ICD-10-CM

## 2023-09-22 MED ORDER — HYDROCHLOROTHIAZIDE 25 MG PO TABS: 25.0000 mg | ORAL_TABLET | Freq: Every day | ORAL | 3 refills | Status: AC

## 2023-09-22 MED ORDER — ROSUVASTATIN CALCIUM 10 MG PO TABS: 10.0000 mg | ORAL_TABLET | Freq: Every day | ORAL | 3 refills | Status: AC

## 2023-09-22 NOTE — Patient Instructions (Signed)
 Medication Instructions:  STOP OLMESARTAN -HCT   START hydrochlorothiazide  25 MG DAILY   START ROSUVASTATIN 10 MG DAILY   *If you need a refill on your cardiac medications before your next appointment, please call your pharmacy*  Lab Work: FASTING LP/CMET IN 2 MONTHS   If you have labs (blood work) drawn today and your tests are completely normal, you will receive your results only by: MyChart Message (if you have MyChart) OR A paper copy in the mail If you have any lab test that is abnormal or we need to change your treatment, we will call you to review the results.  Testing/Procedures: NONE  Follow-Up: At [facility], you and your health needs are our priority.  As part of our continuing mission to provide you with exceptional heart care, our providers are all part of one team.  This team includes your primary Cardiologist (physician) and Advanced Practice Providers or APPs (Physician Assistants and Nurse Practitioners) who all work together to provide you with the care you need, when you need it.  Your next appointment:   6 month(s)  Provider:   Neomi Banks, NP   1 YEAR WITH DR [facility]    We recommend signing up for the patient portal called "MyChart".  Sign up information is provided on this After Visit Summary.  MyChart is used to connect with patients for Virtual Visits (Telemedicine).  Patients are able to view lab/test results, encounter notes, upcoming appointments, etc.  Non-urgent messages can be sent to your provider as well.   To learn more about what you can do with MyChart, go to [email or web address].   Other Instructions MONITOR YOUR BLOOD PRESSURE AT HOME, CALL OR MYCHART UPDATED READINGS IN COUPLE WEEKS

## 2023-09-22 NOTE — Progress Notes (Signed)
 Cardiology Office Note:  .   Date:  09/22/2023  ID:  [name], DOB 08/06/1965, MRN [id number] PCP: Helyn Lobstein, MD  [facility] Providers Cardiologist:  Maudine Sos, MD     History of Present Illness: Lori Luna    Lori Luna is a 58 y.o. female with aortic atherosclerosis, hypertension, OSA on CPAP, and hyperlipidemia who presents for follow-up.  Lori Luna was initially seen 06/2016 for cardiovascular risk assessment.  She has a family history of CAD and had some episodes of atypical chest pain.  She was referred for coronary CT-a 07/2016 that revealed a coronary calcium score of 0.    Her blood pressure was elevated and enalapril  was switched to to olmesartan  20 mg.  Prior to that change she had a cough that has since improved.    She was hospitalized for withdrawal from gabapentin.  Telmisartan was subsequently switched to telmisartan/HCTZ.  In February she had an episode of waking up with left chest and arm pain.  She described it as a burning pain that did not change with exertion, though she has been on it while unable to exert herself much lately due to an injury to her foot.  There is no associated shortness of breath and the pain was better with palpation of the chest and arm.   Lori Luna had chest pain that was thought to be due to cervical radiculopathy.  Her blood pressures were running high at home but were controlled in the office.  She followed up with Lawana Pray, NP on 12/2019 and was doing better.  [name] had COVID-19 in early January 2021. At her last appointment, her blood pressure was above goal but she wanted to work on diet and exercise. Her pharmacist added amlodipine  and she participated in [place].  She has participated in [facility].  She had surgery for an acoustic neuroma that was causing hearing loss and contributed to disequilibrium/balance issues.   At her visit 01/2023 she was still struggling with vertigo.  She also  noted exertional dyspnea and lower extremity edema.  She was also struggling with weight loss and was referred to the Pharm.D. for GLP-1.  Blood pressure was controlled but it was thought amlodipine  was contributing to her edema so she was started on spironolactone .  She followed up with our pharmacist and was started on Zepbound .  She also continues to work with healthy weight and wellness.  Discussed the use of AI scribe software for clinical note transcription with the patient, who gave verbal consent to proceed.  History of Present Illness Lori Luna is currently on Zepbound  at a dose of 7.5 mg, which causes nausea for the first two days after administration. She prefers the 5 mg dose but is continuing with the current regimen for now. She has experienced significant improvement in joint pain and inflammation, particularly in her back and other joints, which she describes as 'amazing'.  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 However, she notes feeling much better when she is able to exercise, particularly with walking, and reports no issues with chest pain or shortness of breath during physical activity.  She has a history of hypertension and has recently stopped taking amlodipine  due to episodes of dizziness and low blood pressure readings. She is currently on a combination of olmesartan  and hydrochlorothiazide . Her blood pressure has been lower than usual, sometimes causing dizziness, and she has taken herself  off amlodipine  as a result.  She has a family history of cardiovascular disease and is concerned about her cholesterol levels. Her recent lab work showed a decrease in total cholesterol from 247 to 204 mg/dL, and LDL cholesterol improved from 158 to 125 mg/dL. However, her HDL decreased from 65 to 51 mg/dL, and triglycerides increased from 135 to 116 mg/dL. She has made significant dietary changes, particularly reducing  fast food intake, but is puzzled by the triglyceride levels.  She has a history of a brain tumor and mentions difficulty swallowing, which she attributes to residual effects from the tumor. She is not scheduled for another scan for four years. She also reports a family history of dementia and is concerned about the potential cognitive effects of cholesterol medications.   ROS:  As per HPI  Studies Reviewed: Lori Luna       Coronary CT-A 07/2016: IMPRESSION: 1. Coronary calcium score of 0. This was 0 percentile for age and sex matched control.   2. Normal coronary origin with right dominance.   3. No evidence of CAD.  CT Abd/pelvis 02/2021: IMPRESSION: 1. Long segment small bowel-small bowel jejunal intussusception in the left upper quadrant measuring approximately 4.5 cm in length, suspicious for underlying lead point/mass. Recommend GI surgery consult for further evaluation. 2. Chronic appearing L5 pars defects with grade 1 L5 on S1 anterolisthesis. 3. Aortic Atherosclerosis (ICD10-I70.0).  Risk Assessment/Calculations:             Physical Exam:   VS:  BP 102/72   Pulse 60   Ht 5\' 5"  (1.651 m)   Wt 214 lb (97.1 kg)   LMP 10/19/2018 (Exact Date)   SpO2 96%   BMI 35.61 kg/m  , BMI Body mass index is 35.61 kg/m. GENERAL:  Well appearing HEENT: Pupils equal round and reactive, fundi not visualized, oral mucosa unremarkable NECK:  No jugular venous distention, waveform within normal limits, carotid upstroke brisk and symmetric, no bruits, no thyromegaly LUNGS:  Clear to auscultation bilaterally HEART:  RRR.  PMI not displaced or sustained,S1 and S2 within normal limits, no S3, no S4, no clicks, no rubs, no murmurs ABD:  Flat, positive bowel sounds normal in frequency in pitch, no bruits, no rebound, no guarding, no midline pulsatile mass, no hepatomegaly, no splenomegaly EXT:  2 plus pulses throughout, no edema, no cyanosis no clubbing SKIN:  No rashes no nodules NEURO:   Cranial nerves II through XII grossly intact, motor grossly intact throughout PSYCH:  Cognitively intact, oriented to person place and time   ASSESSMENT AND PLAN: .    Assessment & Plan # Hypertension Blood pressure controlled with olmesartan  and hydrochlorothiazide , but dizziness suggests hypotension. Goal: maintain BP <130/80 mmHg. Family history of cardiovascular disease requires careful management. - Discontinue olmesartan , continue hydrochlorothiazide  at 25mg  daily.  - Monitor BP at home twice daily for two weeks to ensure it remains <130/80. - Report BP readings after two weeks for treatment adjustment.  # Hyperlipidemia Improved LDL, increased triglycerides, decreased HDL. Family history of cardiovascular disease and aortic plaque necessitates aggressive management. Statins reduce cardiovascular and vascular dementia risk. Addressed cognitive effect concerns. - Initiate low-dose statin therapy. - Recheck fasting lipid panel in 2-3 months. - Educated on statin side effects, advised to report adverse effects.  # Aortic atherosclerosis Aortic plaque present, asymptomatic. No current indication for further imaging unless symptoms develop. Focus on aggressive risk factor management. - Aggressively manage hyperlipidemia and hypertension. - No repeat cardiac CT or calcium score unless  symptoms develop.  # Elevated liver enzymes Recent labs showed minimally elevated ASAT, cause unspecified. Likely hepatic steatosis.  Further evaluation if abnormalities persist. - Recheck liver function tests in 2-3 months.    Dispo: f/u with Caitlin in 6 months.  Sybrina Laning C. Theodis Fiscal, MD, [facility] in 1 year.   Signed, Maudine Sos, MD

## 2023-09-23 ENCOUNTER — Other Ambulatory Visit (HOSPITAL_BASED_OUTPATIENT_CLINIC_OR_DEPARTMENT_OTHER): Payer: Self-pay | Admitting: Obstetrics & Gynecology

## 2023-09-23 DIAGNOSIS — Z79890 Hormone replacement therapy (postmenopausal): Secondary | ICD-10-CM

## 2023-09-30 ENCOUNTER — Ambulatory Visit (INDEPENDENT_AMBULATORY_CARE_PROVIDER_SITE_OTHER): Admitting: Family Medicine

## 2023-10-05 ENCOUNTER — Ambulatory Visit (INDEPENDENT_AMBULATORY_CARE_PROVIDER_SITE_OTHER): Payer: Self-pay | Admitting: Family Medicine

## 2023-10-05 ENCOUNTER — Encounter (INDEPENDENT_AMBULATORY_CARE_PROVIDER_SITE_OTHER): Payer: Self-pay

## 2023-10-25 ENCOUNTER — Encounter: Payer: Self-pay | Admitting: Pharmacist Clinician (PhC)/ Clinical Pharmacy Specialist

## 2023-10-25 ENCOUNTER — Encounter (INDEPENDENT_AMBULATORY_CARE_PROVIDER_SITE_OTHER): Payer: Self-pay | Admitting: Family Medicine

## 2023-10-25 MED ORDER — ZEPBOUND 10 MG/0.5ML ~~LOC~~ SOLN: 10.0000 mg | SUBCUTANEOUS | 11 refills | Status: AC

## 2023-10-28 ENCOUNTER — Ambulatory Visit (INDEPENDENT_AMBULATORY_CARE_PROVIDER_SITE_OTHER): Admitting: Family Medicine

## 2023-11-04 DIAGNOSIS — D225 Melanocytic nevi of trunk: Secondary | ICD-10-CM | POA: Diagnosis not present

## 2023-11-04 DIAGNOSIS — L700 Acne vulgaris: Secondary | ICD-10-CM | POA: Diagnosis not present

## 2023-11-04 DIAGNOSIS — R202 Paresthesia of skin: Secondary | ICD-10-CM | POA: Diagnosis not present

## 2023-11-04 DIAGNOSIS — L720 Epidermal cyst: Secondary | ICD-10-CM | POA: Diagnosis not present

## 2023-11-22 ENCOUNTER — Ambulatory Visit (INDEPENDENT_AMBULATORY_CARE_PROVIDER_SITE_OTHER): Admitting: Family Medicine

## 2023-11-22 ENCOUNTER — Encounter (INDEPENDENT_AMBULATORY_CARE_PROVIDER_SITE_OTHER): Payer: Self-pay

## 2023-11-28 ENCOUNTER — Other Ambulatory Visit (HOSPITAL_BASED_OUTPATIENT_CLINIC_OR_DEPARTMENT_OTHER): Payer: Self-pay | Admitting: Obstetrics & Gynecology

## 2023-11-28 DIAGNOSIS — Z79890 Hormone replacement therapy (postmenopausal): Secondary | ICD-10-CM

## 2024-01-19 ENCOUNTER — Ambulatory Visit (INDEPENDENT_AMBULATORY_CARE_PROVIDER_SITE_OTHER): Admitting: Otolaryngology

## 2024-01-19 ENCOUNTER — Encounter (INDEPENDENT_AMBULATORY_CARE_PROVIDER_SITE_OTHER): Payer: Self-pay | Admitting: Otolaryngology

## 2024-01-19 VITALS — BP 128/85 | HR 82 | Temp 97.6°F | Ht 66.5 in | Wt 202.0 lb

## 2024-01-19 DIAGNOSIS — H9391 Unspecified disorder of right ear: Secondary | ICD-10-CM | POA: Diagnosis not present

## 2024-01-19 DIAGNOSIS — H9201 Otalgia, right ear: Secondary | ICD-10-CM

## 2024-01-19 DIAGNOSIS — H9042 Sensorineural hearing loss, unilateral, left ear, with unrestricted hearing on the contralateral side: Secondary | ICD-10-CM

## 2024-01-19 DIAGNOSIS — D333 Benign neoplasm of cranial nerves: Secondary | ICD-10-CM

## 2024-01-19 NOTE — Progress Notes (Unsigned)
 Patient ID: Lori Luna, female   DOB: November 13, 1965, 58 y.o.   MRN: [id number]  Follow-up: Left ear hearing loss, vestibular schwannoma  HPI: 5/23 The patient is a 58 year old female who returns today for follow-up evaluation. She was previously seen for asymmetric left ear sensorineural hearing loss. She subsequently underwent an MRI scan. 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 The tumor was noted to exert mass-effect on the pons and cerebellum, without brain edema. The diagnosis, clinical course, and pathophysiology of vestibular schwannoma were extensively discussed.  The patient was subsequently evaluated by multiple tertiary care neuro-otologist, including Dr. Garry, Dr. Stacia, and the [facility] ENT and gamma knife department.  The patient returns today complaining that she has no usable hearing on the left side.  She has several questions regarding her treatment plan.   Objective Objective note General: Communicates without difficulty, well nourished, no acute distress. Head: Normocephalic, no evidence injury, no tenderness, facial buttresses intact without stepoff. Face/sinus: No tenderness to palpation and percussion. Facial movement is normal and symmetric. Eyes: PERRL, EOMI. No scleral icterus, conjunctivae clear. Neuro: CN II exam reveals vision grossly intact.  No nystagmus at any point of gaze. Ears: Auricles well formed without lesions.  Ear canals are intact without mass or lesion.  No erythema or edema is appreciated.  The TMs are intact without fluid. Nose: External evaluation reveals normal support and skin without lesions.  Dorsum is intact.  Anterior rhinoscopy reveals congested mucosa over anterior aspect of inferior turbinates and intact septum.  No purulence noted. Oral:  Oral cavity and oropharynx are intact, symmetric, without erythema or edema.  Mucosa is moist without lesions. Neck: Full range of motion without pain.  There is no significant  lymphadenopathy.  No masses palpable.  Thyroid  bed within normal limits to palpation.  Parotid glands and submandibular glands equal bilaterally without mass.  Trachea is midline. Neuro:  CN 2-12 grossly intact. Gait normal.    Observations Functional status No functional status recorded  Cognitive status No cognitive status recorded  Assessment Assessment note 1. 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  2. Her ear canals, tympanic membranes, and middle ear spaces are otherwise normal.   Screenings/Interventions/Assessments No screenings/interventions/assessments recorded  Diagnoses attached to encounter No diagnoses attached  Plan Plan note 1.  The diagnosis, clinical course, and pathophysiology of vestibular schwannoma are again discussed.   2.  The patient has several questions regarding the backgrounds of Dr. McElveen, Dr. Stacia.  All questions are invited and answered.  3.  At this time, the patient is leaning towards surgical resection of the acoustic neuroma via the labyrinthine approach.  4.  The patient will make a decision regarding where she will have the surgery in the near future.

## 2024-01-20 DIAGNOSIS — H9042 Sensorineural hearing loss, unilateral, left ear, with unrestricted hearing on the contralateral side: Secondary | ICD-10-CM | POA: Insufficient documentation

## 2024-01-20 DIAGNOSIS — H9201 Otalgia, right ear: Secondary | ICD-10-CM | POA: Insufficient documentation

## 2024-02-02 ENCOUNTER — Encounter (INDEPENDENT_AMBULATORY_CARE_PROVIDER_SITE_OTHER): Payer: Self-pay

## 2024-02-29 ENCOUNTER — Ambulatory Visit (INDEPENDENT_AMBULATORY_CARE_PROVIDER_SITE_OTHER): Admitting: Audiology

## 2024-02-29 ENCOUNTER — Ambulatory Visit (INDEPENDENT_AMBULATORY_CARE_PROVIDER_SITE_OTHER): Admitting: Otolaryngology

## 2024-02-29 ENCOUNTER — Ambulatory Visit (INDEPENDENT_AMBULATORY_CARE_PROVIDER_SITE_OTHER): Payer: Self-pay | Admitting: Audiology

## 2024-02-29 DIAGNOSIS — H9042 Sensorineural hearing loss, unilateral, left ear, with unrestricted hearing on the contralateral side: Secondary | ICD-10-CM

## 2024-02-29 NOTE — Progress Notes (Signed)
   [address] [place], [place] [address] [phone]  Hearing Aid Check     Jania Steinke comes for a scheduled appointment for a hearing aid check.  Accompanied ab:[id number]    Right Left  Hearing aid manufacturer Oticon More 3 miniRITE R SN: [id number] Oticon More 3 miniRITE R SN: [id number]  Hearing aid style Receiver in the ear Receiver in the ear  Hearing aid battery rechargeable rechargeable  Receiver 1-60 1-60  Dome/ custom earpiece 6mm open 6mm open  Retention wire yes yes  Warranty expiration date 04-06-2024 04-06-2024  Loss and Damage expired expired  Initial fitting date 03-12-2021 03-12-2021  [facility]    Chief complaint: Patient reports only wears the aids when she needs it the most. She gave as an example when she goes to restaurants. She does not have any issues with the aids or the charger.  Actions taken: Patient agree to send the aids in for an overall check before warranty ends. She knows it may take around 2 weeks for the turn around. We will call her when they come in.    Services fee: $0 was paid at checkout and $0 will be due at pick up.  Patient was oriented about service fees that would be applicable after warranty runs out.  Recommend: Return for a hearing aid check, as needed. Return for a hearing evaluation and to see an ENT, if concerns with hearing changes arise.    Bertil Brickey MARIE LEROUX-MARTINEZ, AUD

## 2024-02-29 NOTE — Progress Notes (Signed)
  [address] [place], [place] [address] [phone]  Audiological Evaluation    Name: Lori Luna     DOB:   09-01-1965      MRN:   [id number]                                                                                     Service Date: 02/29/2024     Accompanied by: unaccompanied   Patient comes today after Dr. Karis, ENT sent a referral for a hearing evaluation due to concerns with left vestibular schwannoma.   Symptoms Yes Details  Hearing loss  [x]  Known left hearing loss, patient reports no hearing since she had the left vestibular schwannoma removed in 2024. Patient reports it was completed at [facility].  Tinnitus  []  denied  Ear pain/ infections/pressure  []    Balance problems  []  denied  Noise exposure history  []    Previous ear surgeries  []    Family history of hearing loss  []    Amplification  [x]  Has a set of Oticon CROS hearing aids that were fit in 2022 at [facility]. Patient reports only wears it when she goes to a restaurant.  Other  []      Otoscopy: Right ear: Clear external ear canal and notable landmarks visualized on the tympanic membrane. Left ear:  Clear external ear canal and notable landmarks visualized on the tympanic membrane.  Tympanometry: Right ear: Normal external ear canal volume with normal middle ear pressure and tympanic membrane compliance (Type A). Findings are suggestive of normal middle ear function. Left ear: Normal external ear canal volume with normal middle ear pressure and tympanic membrane compliance (Type A). Findings are suggestive of normal middle ear function.  Hearing Evaluation The hearing test results were completed under headphones and results are deemed to be of good reliability. Test technique:  conventional    Pure tone Audiometry: Right ear- Normal hearing from 125-800 Hz.   Left ear-  Profound sensorineural hearing loss (mainly vibrotactile responses) from 125 Hz - 8000  Hz.  Speech Audiometry: Right ear- Speech Reception Threshold (SRT) was obtained at 5 dBHL. Left ear-Speech Reception Threshold (SRT) was obtained at  dBHL.   Word Recognition Score Tested using NU-6 (recorded) Right ear: 100% was obtained at a presentation level of 50 dBHL without contralateral masking which is deemed as  excellent. Left ear: Could not be tested due to degree of hearing loss.    Impression: There is a significant difference in pure-tone thresholds between ears.   Recommendations: Follow up with ENT as per MD.  Return for a hearing evaluation if concerns with hearing changes arise or per MD recommendation. Recommend continue using her CROS hearing aids.   [name], AUD

## 2024-03-02 ENCOUNTER — Telehealth (INDEPENDENT_AMBULATORY_CARE_PROVIDER_SITE_OTHER): Payer: Self-pay | Admitting: Otolaryngology

## 2024-03-02 NOTE — Telephone Encounter (Signed)
 Patient stated that she will wait to have her MRI scheduled the first of the year.

## 2024-03-14 ENCOUNTER — Telehealth (INDEPENDENT_AMBULATORY_CARE_PROVIDER_SITE_OTHER): Payer: Self-pay | Admitting: Audiology

## 2024-03-14 NOTE — Telephone Encounter (Signed)
 I called and spoke with the patient per Dr. Tex request.  I let her know that her hearing aids are ready for pickup.  They were sent out for an overall check before the warranty expires.  $0 due at pickup.

## 2024-04-04 ENCOUNTER — Ambulatory Visit (INDEPENDENT_AMBULATORY_CARE_PROVIDER_SITE_OTHER): Payer: Self-pay | Admitting: Audiology

## 2024-04-04 NOTE — Progress Notes (Unsigned)
°  [address] [place], [place] [address] [phone]  Hearing Aid Check     Lori Luna walks in to pick up hearing aid repair.      Right Left  Hearing aid manufacturer Oticon More 3 miniRITE R SN: [id number] Oticon More 3 miniRITE R SN: [id number]  Hearing aid style Receiver in the ear Receiver in the ear  Hearing aid battery rechargeable rechargeable  Receiver 1-60 1-60  Dome/ custom earpiece 6mm open 6mm open  Retention wire yes yes  Warranty expiration date 04-06-2024 04-06-2024  Loss and Damage expired expired  Initial fitting date 03-12-2021 03-12-2021  [facility]: [facility]     Actions taken: Previous settings saved in the aid. Patient walked in to pick up the repair. Ms. Wylie helped the patient. No concerns expressed by patient at pick up.  Services fee: $0 was paid at checkout.   Recommend: Return for a hearing aid check, as needed. Return for a hearing evaluation and to see an ENT, if concerns with hearing changes arise.    Lori Luna Lori Luna, AUD
